# Patient Record
Sex: Male | Born: 1980 | Race: Asian | Hispanic: No | Marital: Married | Smoking: Never smoker
Health system: Southern US, Community
[De-identification: ages and names within clinical notes are randomized; demographics above are authoritative.]

## PROBLEM LIST (undated history)

## (undated) DIAGNOSIS — E785 Hyperlipidemia, unspecified: Secondary | ICD-10-CM

## (undated) DIAGNOSIS — R7989 Other specified abnormal findings of blood chemistry: Secondary | ICD-10-CM

## (undated) HISTORY — DX: Hyperlipidemia, unspecified: E78.5

## (undated) HISTORY — DX: Other specified abnormal findings of blood chemistry: R79.89

---

## 2016-07-15 LAB — LIPID PANEL
Cholesterol: 196 mg/dL (ref 0–200)
HDL: 24 mg/dL — AB (ref 35–70)
LDL CALC: 116 mg/dL
Triglycerides: 281 mg/dL — AB (ref 40–160)

## 2016-07-15 LAB — BASIC METABOLIC PANEL: Glucose: 106 mg/dL

## 2016-09-13 ENCOUNTER — Telehealth: Payer: Self-pay | Admitting: *Deleted

## 2016-09-13 NOTE — Telephone Encounter (Signed)
PreVisit Call attempted. Pt states that he filled out all of the new patient packet and he will bring it in tomorrow.

## 2016-09-14 ENCOUNTER — Encounter: Payer: Self-pay | Admitting: Family Medicine

## 2016-09-14 ENCOUNTER — Ambulatory Visit (INDEPENDENT_AMBULATORY_CARE_PROVIDER_SITE_OTHER): Payer: Managed Care, Other (non HMO) | Admitting: Family Medicine

## 2016-09-14 VITALS — BP 134/82 | HR 96 | Temp 97.7°F | Ht 75.0 in | Wt 217.2 lb

## 2016-09-14 DIAGNOSIS — E782 Mixed hyperlipidemia: Secondary | ICD-10-CM | POA: Diagnosis not present

## 2016-09-14 DIAGNOSIS — E663 Overweight: Secondary | ICD-10-CM | POA: Diagnosis not present

## 2016-09-14 DIAGNOSIS — R7989 Other specified abnormal findings of blood chemistry: Secondary | ICD-10-CM

## 2016-09-14 DIAGNOSIS — E785 Hyperlipidemia, unspecified: Secondary | ICD-10-CM | POA: Insufficient documentation

## 2016-09-14 DIAGNOSIS — E559 Vitamin D deficiency, unspecified: Secondary | ICD-10-CM | POA: Diagnosis not present

## 2016-09-14 LAB — LIPID PANEL
Cholesterol: 197 mg/dL (ref 0–200)
HDL: 33 mg/dL — ABNORMAL LOW (ref >39.00)
LDL Cholesterol: 142 mg/dL — ABNORMAL HIGH (ref 0–99)
NonHDL: 164.16
Total CHOL/HDL Ratio: 6
Triglycerides: 112 mg/dL (ref 0.0–149.0)
VLDL: 22.4 mg/dL (ref 0.0–40.0)

## 2016-09-14 LAB — VITAMIN D 25 HYDROXY (VIT D DEFICIENCY, FRACTURES): VITD: 15.63 ng/mL — ABNORMAL LOW (ref 30.00–100.00)

## 2016-09-14 NOTE — Progress Notes (Signed)
Brent Pineda is a 36 y.o. male is here to [employer].   History of Present Illness:   Insurance claims handler, CMA, acting as scribe for Dr. Earlene Plater.  CC:  The patient comes in today to establish care.  Recently moved here from [place], [place].  States he had lab work done in January 2018 that showed elevated triglycerides.  He has since been dieting and has lost 20 pounds.  Would like to have labs done today to see where his numbers are.  No specific complaints or concerns.  HPI:  Brent Pineda is a 37 y.o. male who presents for evaluation of dyslipidemia. The patient does not use medications that may worsen dyslipidemias (corticosteroids, progestins, anabolic steroids, diuretics, beta-blockers, amiodarone, cyclosporine, olanzapine). Exercise: three times a week. Previous history of cardiac disease includes: None. Cardiovascular ROS: no chest pain or dyspnea on exertion.  Lipids:    Component Value Date/Time   CHOL 196 07/15/2016   TRIG 281 (A) 07/15/2016   HDL 24 (A) 07/15/2016   Health Maintenance Due  Topic Date Due  . HIV Screening  02/11/1997  . TETANUS/TDAP  02/11/2001   PMHx, SurgHx, SocialHx, Medications, and Allergies were reviewed in the Visit Navigator and updated as appropriate.   Past Medical History:  Diagnosis Date  . HLD (hyperlipidemia)   . Low vitamin D level    History reviewed. No pertinent surgical history.  History reviewed. No pertinent family history.  Social History  Substance Use Topics  . Smoking status: Never Smoker  . Smokeless tobacco: Never Used  . Alcohol use Yes     Comment: occasionally    Current Medications and Allergies:   No current outpatient prescriptions on file.  No Known Allergies   Review of Systems:   Review of Systems  Constitutional: Negative for chills and fever.  HENT: Negative for congestion.   Eyes: Negative for blurred vision.  Respiratory: Negative for cough.   Cardiovascular: Negative for chest pain and  palpitations.  Gastrointestinal: Negative for abdominal pain and nausea.  Genitourinary: Negative for frequency.  Musculoskeletal: Negative for back pain.  Skin: Negative for rash.  Neurological: Negative for loss of consciousness and headaches.  Psychiatric/Behavioral: Negative for depression. The patient is not nervous/anxious.     Vitals:   Vitals:   09/14/16 0908  BP: 134/82  Pulse: 96  Temp: 97.7 F (36.5 C)  TempSrc: Oral  SpO2: 96%  Weight: 217 lb 3.2 oz (98.5 kg)  Height: 6\' 3"  (1.905 m)     Body mass index is 27.15 kg/m.   Physical Exam:   Physical Exam  Constitutional: He is oriented to person, place, and time. He appears well-developed and well-nourished. No distress.  HENT:  Head: Normocephalic and atraumatic.  Right Ear: External ear normal.  Left Ear: External ear normal.  Nose: Nose normal.  Mouth/Throat: Oropharynx is clear and moist.  Eyes: Conjunctivae and EOM are normal. Pupils are equal, round, and reactive to light.  Neck: Normal range of motion. Neck supple.  Cardiovascular: Normal rate, regular rhythm, normal heart sounds and intact distal pulses.   Pulmonary/Chest: Effort normal and breath sounds normal.  Abdominal: Soft. Bowel sounds are normal.  Musculoskeletal: Normal range of motion.  Neurological: He is alert and oriented to person, place, and time.  Skin: Skin is warm and dry.  Psychiatric: He has a normal mood and affect. His behavior is normal. Judgment and thought content normal.  Nursing note and vitals reviewed.    Assessment and Plan:  Brent BonesSatyadev was seen today for establish care.  Diagnoses and all orders for this visit:  Mixed hyperlipidemia -     Lipid panel  Low vitamin D level -     VITAMIN D 25 Hydroxy (Vit-D Deficiency, Fractures)  Overweight (BMI 25.0-29.9) Comments: The patient is asked to make an attempt to improve diet and exercise patterns to aid in medical management of this problem.  . Reviewed  expectations re: course of current medical issues. . Discussed self-management of symptoms. . Outlined signs and symptoms indicating need for more acute intervention. . Patient verbalized understanding and all questions were answered. . See orders for this visit as documented in the electronic medical record. . Patient received an After Visit Summary.  Records requested if needed. I spent 30 minutes with this patient, greater than 50% was face-to-face time counseling regarding the above diagnoses.  CMA served as Neurosurgeonscribe during this visit. History, Physical, and Plan performed by medical provider. Documentation and orders reviewed and attested to. Helane RimaErica Nimra Puccinelli, D.O.  Helane RimaErica Desean Heemstra, D.O. [place] 09/14/2016  There are no discontinued medications. Orders Placed This Encounter  Procedures  . Basic metabolic panel  . Lipid panel  . Lipid panel  . VITAMIN D 25 Hydroxy (Vit-D Deficiency, Fractures)

## 2016-09-14 NOTE — Progress Notes (Signed)
Pre visit review using our clinic review tool, if applicable. No additional management support is needed unless otherwise documented below in the visit note. 

## 2016-09-15 ENCOUNTER — Telehealth: Payer: Self-pay | Admitting: Family Medicine

## 2016-09-15 ENCOUNTER — Telehealth: Payer: Self-pay

## 2016-09-15 MED ORDER — CHOLECALCIFEROL 1.25 MG (50000 UT) PO TABS: ORAL_TABLET | ORAL | 0 refills | Status: DC

## 2016-09-15 NOTE — Telephone Encounter (Signed)
Received call from RN at [facility].  Patient was on the phone stating that he only had one tube of blood drawn at yesterday's visit and his wife says he should have had 3 tubes drawn.  Informed nurse from [facility] that vitamin D and lipid panel were the only tests ordered and the lab drew the amount they needed for those two tests.  When Dr. Earlene PlaterWallace has reviewed test results, we will contact the patient.  Nurse verbalized understanding and stated she would inform the patient.  *patient also sent a MyChart message this morning about the same thing and it was responded to by Britt BottomJamie Wheeley.*

## 2016-09-15 NOTE — Addendum Note (Signed)
Addended by: Helane RimaWALLACE, Chalon Zobrist R on: 09/15/2016 10:40 AM   Modules accepted: Orders

## 2016-09-15 NOTE — Telephone Encounter (Signed)
[facility] Night - [place] TELEPHONE ADVICE RECORD [facility] Patient Name: Brent Pineda DOB: Oct 22, 1980 Initial Comment Caller states was in yesterday for bloodwork; they only took one sample; when checked w/his wife she said should have been 3; need to come back to have more blood work drawn? Nurse Assessment Nurse: Sandria ManlyLove, RN, Lyla Sonarrie Date/Time (Eastern Time): 09/15/2016 8:45:21 AM Confirm and document reason for call. If symptomatic, describe symptoms. ---Caller states was in yesterday for blood work & they only took one sample/1 tube. When checked w/his wife she said should have been 3. He is wondering if he needs to come back to have more blood work drawn. No new or worsening symptoms. Does the patient have any new or worsening symptoms? ---No Guidelines Guideline Title Affirmed Question Affirmed Notes Final Disposition User Comments Spoke with office & confirmed they got everything they needed. Advised patient they collected all the blood they needed for a Lipid panel & Vit D test. Caller verbalized understanding.

## 2016-11-12 ENCOUNTER — Other Ambulatory Visit: Payer: Self-pay | Admitting: Family Medicine

## 2016-11-12 DIAGNOSIS — R7989 Other specified abnormal findings of blood chemistry: Secondary | ICD-10-CM

## 2016-11-12 NOTE — Telephone Encounter (Signed)
Please advise on refill. Does patient need to come back in to redraw labs?

## 2016-11-12 NOTE — Telephone Encounter (Signed)
Take OTC Vitamin D 5000 IU daily.

## 2016-11-12 NOTE — Telephone Encounter (Signed)
Notified patient.

## 2017-09-28 ENCOUNTER — Ambulatory Visit (INDEPENDENT_AMBULATORY_CARE_PROVIDER_SITE_OTHER): Payer: Managed Care, Other (non HMO) | Admitting: Family Medicine

## 2017-09-28 ENCOUNTER — Encounter: Payer: Self-pay | Admitting: Family Medicine

## 2017-09-28 VITALS — BP 110/68 | HR 79 | Temp 98.5°F | Ht 75.0 in | Wt 239.2 lb

## 2017-09-28 DIAGNOSIS — E049 Nontoxic goiter, unspecified: Secondary | ICD-10-CM | POA: Diagnosis not present

## 2017-09-28 DIAGNOSIS — Z0000 Encounter for general adult medical examination without abnormal findings: Secondary | ICD-10-CM | POA: Diagnosis not present

## 2017-09-28 DIAGNOSIS — E663 Overweight: Secondary | ICD-10-CM

## 2017-09-28 DIAGNOSIS — E559 Vitamin D deficiency, unspecified: Secondary | ICD-10-CM | POA: Diagnosis not present

## 2017-09-28 DIAGNOSIS — Z114 Encounter for screening for human immunodeficiency virus [HIV]: Secondary | ICD-10-CM | POA: Diagnosis not present

## 2017-09-28 DIAGNOSIS — E782 Mixed hyperlipidemia: Secondary | ICD-10-CM | POA: Diagnosis not present

## 2017-09-28 LAB — LIPID PANEL
Cholesterol: 208 mg/dL — ABNORMAL HIGH (ref 0–200)
HDL: 35.8 mg/dL — ABNORMAL LOW (ref >39.00)
NonHDL: 171.7
Total CHOL/HDL Ratio: 6
Triglycerides: 242 mg/dL — ABNORMAL HIGH (ref 0.0–149.0)
VLDL: 48.4 mg/dL — ABNORMAL HIGH (ref 0.0–40.0)

## 2017-09-28 LAB — COMPREHENSIVE METABOLIC PANEL
ALT: 23 U/L (ref 0–53)
AST: 20 U/L (ref 0–37)
Albumin: 4.3 g/dL (ref 3.5–5.2)
Alkaline Phosphatase: 62 U/L (ref 39–117)
BUN: 10 mg/dL (ref 6–23)
CO2: 27 mEq/L (ref 19–32)
Calcium: 10 mg/dL (ref 8.4–10.5)
Chloride: 104 mEq/L (ref 96–112)
Creatinine, Ser: 0.7 mg/dL (ref 0.40–1.50)
GFR: 135.91 mL/min (ref >60.00)
Glucose, Bld: 96 mg/dL (ref 70–99)
Potassium: 4.4 mEq/L (ref 3.5–5.1)
Sodium: 138 mEq/L (ref 135–145)
Total Bilirubin: 0.6 mg/dL (ref 0.2–1.2)
Total Protein: 7.8 g/dL (ref 6.0–8.3)

## 2017-09-28 LAB — CBC WITH DIFFERENTIAL/PLATELET
Basophils Absolute: 0 10*3/uL (ref 0.0–0.1)
Basophils Relative: 0.7 % (ref 0.0–3.0)
Eosinophils Absolute: 0 10*3/uL (ref 0.0–0.7)
Eosinophils Relative: 0.5 % (ref 0.0–5.0)
HCT: 43.8 % (ref 39.0–52.0)
Hemoglobin: 14.7 g/dL (ref 13.0–17.0)
Lymphocytes Relative: 30.3 % (ref 12.0–46.0)
Lymphs Abs: 1.8 10*3/uL (ref 0.7–4.0)
MCHC: 33.7 g/dL (ref 30.0–36.0)
MCV: 87 fl (ref 78.0–100.0)
Monocytes Absolute: 0.4 10*3/uL (ref 0.1–1.0)
Monocytes Relative: 6.2 % (ref 3.0–12.0)
Neutro Abs: 3.7 10*3/uL (ref 1.4–7.7)
Neutrophils Relative %: 62.3 % (ref 43.0–77.0)
Platelets: 220 10*3/uL (ref 150.0–400.0)
RBC: 5.03 Mil/uL (ref 4.22–5.81)
RDW: 13.8 % (ref 11.5–15.5)
WBC: 6 10*3/uL (ref 4.0–10.5)

## 2017-09-28 LAB — VITAMIN D 25 HYDROXY (VIT D DEFICIENCY, FRACTURES): VITD: 21.97 ng/mL — ABNORMAL LOW (ref 30.00–100.00)

## 2017-09-28 LAB — TSH: TSH: 1.64 u[IU]/mL (ref 0.35–4.50)

## 2017-09-28 LAB — LDL CHOLESTEROL, DIRECT: Direct LDL: 139 mg/dL

## 2017-09-28 NOTE — Progress Notes (Signed)
Subjective:    Tristin Vandeusen is a 37 y.o. male who presents today for his Complete Annual Exam. He feels well. He is not exercising regularly. He reports he is sleeping fairly well.   Health Maintenance Due  Topic Date Due  . HIV Screening  02/11/1997   PMHx, SurgHx, SocialHx, Medications, and Allergies were reviewed in the Visit Navigator and updated as appropriate.   Past Medical History:  Diagnosis Date  . HLD (hyperlipidemia)   . Low vitamin D level    History reviewed. No pertinent surgical history. History reviewed. No pertinent family history.   Social History   Tobacco Use  . Smoking status: Never Smoker  . Smokeless tobacco: Never Used  Substance Use Topics  . Alcohol use: Yes    Comment: occasionally  . Drug use: No   Review of Systems:   Pertinent items are noted in the HPI. Otherwise, ROS is negative.  Objective:   Vitals:   09/28/17 0824  BP: 110/68  Pulse: 79  Temp: 98.5 F (36.9 C)  SpO2: 97%   Body mass index is 29.9 kg/m.  General Appearance:  Alert, cooperative, no distress, appears stated age  Head:  Normocephalic, without obvious abnormality, atraumatic  Eyes:  PERRL, conjunctiva/corneas clear, EOM's intact, fundi benign, both eyes       Ears:  Normal TM's and external ear canals, both ears  Nose: Nares normal, septum midline, mucosa normal, no drainage    or sinus tenderness  Throat: Lips, mucosa, and tongue normal; teeth and gums normal  Neck: Supple, symmetrical, trachea midline, no adenopathy; thyroid:  No enlargement/tenderness/nodules; no carotit bruit or JVD  Back:   Symmetric, no curvature, ROM normal, no CVA tenderness  Lungs:   Clear to auscultation bilaterally, respirations unlabored  Chest wall:  No tenderness or deformity  Heart:  Regular rate and rhythm, S1 and S2 normal, no murmur, rub   or gallop  Abdomen:   Soft, non-tender, bowel sounds active all four quadrants, no masses, no organomegaly  Extremities: Extremities  normal, atraumatic, no cyanosis or edema  Prostate: Not done.   Skin: Skin color, texture, turgor normal, no rashes or lesions  Lymph nodes: Cervical, supraclavicular, and axillary nodes normal  Neurologic: CNII-XII grossly intact. Normal strength, sensation and reflexes throughout   Assessment/Plan:   Diagnoses and all orders for this visit:  Routine physical examination  Mixed hyperlipidemia -     Comprehensive metabolic panel -     Lipid panel  Overweight (BMI 25.0-29.9)  Goiter -     CBC with Differential/Platelet -     TSH  Vitamin D deficiency -     VITAMIN D 25 Hydroxy (Vit-D Deficiency, Fractures)   Patient Counseling: [x]   Nutrition: Stressed importance of moderation in sodium/caffeine intake, saturated fat and cholesterol, caloric balance, sufficient intake of fresh fruits, vegetables, and fiber.  [x]   Stressed the importance of regular exercise.   []   Substance Abuse: Discussed cessation/primary prevention of tobacco, alcohol, or other drug use; driving or other dangerous activities under the influence; availability of treatment for abuse.   [x]   Injury prevention: Discussed safety belts, safety helmets, smoke detector, smoking near bedding or upholstery.   []   Sexuality: Discussed sexually transmitted diseases, partner selection, use of condoms, avoidance of unintended pregnancy and contraceptive alternatives.   [x]   Dental health: Discussed importance of regular tooth brushing, flossing, and dental visits.  [x]   Health maintenance and immunizations reviewed. Please refer to Health maintenance section.   Alcario Drought  Earlene PlaterWallace, DO [facility]

## 2017-09-29 LAB — HIV ANTIBODY (ROUTINE TESTING W REFLEX): HIV 1&2 Ab, 4th Generation: NONREACTIVE

## 2017-10-10 ENCOUNTER — Other Ambulatory Visit: Payer: Self-pay | Admitting: Family Medicine

## 2017-10-10 MED ORDER — AMOXICILLIN 875 MG PO TABS
875.0000 mg | ORAL_TABLET | Freq: Two times a day (BID) | ORAL | 0 refills | Status: DC
Start: 2017-10-10 — End: 2018-04-04

## 2018-04-04 ENCOUNTER — Ambulatory Visit (INDEPENDENT_AMBULATORY_CARE_PROVIDER_SITE_OTHER): Payer: Managed Care, Other (non HMO)

## 2018-04-04 ENCOUNTER — Ambulatory Visit: Payer: Managed Care, Other (non HMO) | Admitting: Family Medicine

## 2018-04-04 VITALS — BP 126/72 | HR 89 | Temp 98.1°F | Ht 75.0 in | Wt 237.8 lb

## 2018-04-04 DIAGNOSIS — M533 Sacrococcygeal disorders, not elsewhere classified: Secondary | ICD-10-CM

## 2018-04-04 NOTE — Patient Instructions (Signed)
Tailbone Injury The tailbone is the small bone at the lower end of the backbone (spine). You may have stretched tissues, bruises, or a broken bone (fracture). These injuries can be painful. Most tailbone injuries get better on their own in 4-6 weeks. Follow these instructions at home:  Take medicines only as told by your doctor.  If told, apply ice to the injured area. ? Put ice in a plastic bag. ? Place a towel between your skin and the bag. ? Leave the ice on for 20 minutes, 2-3 times per day. Do this for the first 1-2 days.  Sit on a large, rubber or inflated ring or cushion to lessen pain. Lean forward when you sit to help lessen pain.  Avoid sitting in one place for a long time.  Increase your activity as the pain allows.  Do exercises as told by your doctor or physical therapist.  If it is painful to poop, take medicine to help you poop (stool softeners) as told by your doctor.  Eat foods that have plenty of fiber.  Keep all follow-up visits as told by your doctor. This is important. Contact a doctor if:  Your pain gets worse.  Pooping causes you pain.  You cannot poop (constipation).  You are leaking pee (urinary incontinence).  You have a fever. This information is not intended to replace advice given to you by your health care provider. Make sure you discuss any questions you have with your health care provider. Document Released: 07/10/2010 Document Revised: 02/05/2016 Document Reviewed: 06/03/2014 Elsevier Interactive Patient Education  2018 Elsevier Inc.  

## 2018-04-04 NOTE — Progress Notes (Signed)
   Brent Pineda is a 37 y.o. male here for an acute visit.  History of Present Illness:   HPI: Tailbone pain x 1 week after falling down his steps last week. No other injury. Has tried NSAIDs, ice, rest. No prior injury. No constipation. No incontinence.  PMHx, SurgHx, SocialHx, Medications, and Allergies were reviewed in the Visit Navigator and updated as appropriate.  Current Medications:   No current outpatient medications on file.   No Known Allergies   Review of Systems:   Pertinent items are noted in the HPI. Otherwise, ROS is negative.  Vitals:   Vitals:   04/04/18 1500  BP: 126/72  Pulse: 89  Temp: 98.1 F (36.7 C)  TempSrc: Oral  SpO2: 96%  Weight: 237 lb 12.8 oz (107.9 kg)  Height: 6\' 3"  (1.905 m)     Body mass index is 29.72 kg/m.  Physical Exam:   Physical Exam  Constitutional: He is oriented to person, place, and time. He appears well-developed and well-nourished. No distress.  HENT:  Head: Normocephalic and atraumatic.  Right Ear: External ear normal.  Left Ear: External ear normal.  Nose: Nose normal.  Mouth/Throat: Oropharynx is clear and moist.  Eyes: Pupils are equal, round, and reactive to light. Conjunctivae and EOM are normal.  Neck: Normal range of motion. Neck supple.  Cardiovascular: Normal rate, regular rhythm, normal heart sounds and intact distal pulses.  Pulmonary/Chest: Effort normal and breath sounds normal.  Abdominal: Soft. Bowel sounds are normal.  Musculoskeletal: Normal range of motion.       Back:  TTP, worse with flexion.  Neurological: He is alert and oriented to person, place, and time.  Skin: Skin is warm and dry.  Psychiatric: He has a normal mood and affect. His behavior is normal. Judgment and thought content normal.  Nursing note and vitals reviewed.  Assessment and Plan:   Brent Pineda was seen today for tailbone pain.  Diagnoses and all orders for this visit:  Tail bone pain -     DG Sacrum/Coccyx;  Future   . Reviewed expectations re: course of current medical issues. . Discussed self-management of symptoms. . Outlined signs and symptoms indicating need for more acute intervention. . Patient verbalized understanding and all questions were answered. Marland Kitchen Health Maintenance issues including appropriate healthy diet, exercise, and smoking avoidance were discussed with patient. . See orders for this visit as documented in the electronic medical record. . Patient received an After Visit Summary.   Helane Rima, DO [place] 04/05/2018

## 2018-04-05 ENCOUNTER — Encounter: Payer: Self-pay | Admitting: Family Medicine

## 2018-04-12 ENCOUNTER — Ambulatory Visit (INDEPENDENT_AMBULATORY_CARE_PROVIDER_SITE_OTHER): Payer: Managed Care, Other (non HMO) | Admitting: Sports Medicine

## 2018-04-12 ENCOUNTER — Encounter: Payer: Self-pay | Admitting: Sports Medicine

## 2018-04-12 VITALS — BP 110/76 | HR 78 | Ht 75.0 in | Wt 237.2 lb

## 2018-04-12 DIAGNOSIS — S32131A Minimally displaced Zone III fracture of sacrum, initial encounter for closed fracture: Secondary | ICD-10-CM | POA: Diagnosis not present

## 2018-04-12 DIAGNOSIS — M533 Sacrococcygeal disorders, not elsewhere classified: Secondary | ICD-10-CM

## 2018-04-12 MED ORDER — CYCLOBENZAPRINE HCL 5 MG PO TABS: 5.0000 mg | ORAL_TABLET | Freq: Every day | ORAL | 1 refills | Status: DC

## 2018-04-12 NOTE — Progress Notes (Signed)
Brent Pineda. [name] Sports Medicine [facility] [phone]  Brent Pineda - 37 y.o. male MRN [id number]  Date of birth: 02/05/1981  Visit Date: 04/12/2018  PCP: Helane Rima, DO   Referred by: Helane Rima, DO   Scribe(s) for today's visit: Stevenson Clinch, CMA  SUBJECTIVE:  Brent Pineda is here for Initial Assessment (tail bone pain)   HPI: His tail bone pain symptoms INITIALLY: Began about 2 weeks ago and is the result of a fall.  Described as mild soreness with standing, more moderate pain with sitting, radiating to the lower back, tension.  Worsened with sitting.  Improved with standing.  Additional associated symptoms include: XR showed fx of the fifth sacral segment. He denies pain in his legs or groin. He is unable to sleep on his back, he is trying to sleep on his side. He denies loss of control of bladder or bowel fx.     At this time symptoms are improving compared to onset. He has been using IcyHot and Advil prn with temporary relief.     REVIEW OF SYSTEMS: Reports night time disturbances. Denies fevers, chills, or night sweats. Denies unexplained weight loss. Denies personal history of cancer. Denies changes in bowel or bladder habits. Reports recent unreported falls. Denies new or worsening dyspnea or wheezing. Denies headaches or dizziness.  Denies numbness, tingling or weakness  In the extremities.  Denies dizziness or presyncopal episodes Denies lower extremity edema    HISTORY:  Prior history reviewed and updated per electronic medical record.  Social History   Occupational History  . Not on file  Tobacco Use  . Smoking status: Never Smoker  . Smokeless tobacco: Never Used  Substance and Sexual Activity  . Alcohol use: Yes    Comment: occasionally  . Drug use: No  . Sexual activity: Yes   Social History   Social History Narrative  . Not on file   Past Medical History:  Diagnosis Date    . HLD (hyperlipidemia)   . Low vitamin D level    History reviewed. No pertinent surgical history. family history is not on file.  DATA OBTAINED & REVIEWED:   Recent Labs    09/28/17 0844  CALCIUM 10.0  AST 20  ALT 23  TSH 1.64   No problems updated. No specialty comments available.  OBJECTIVE:  VS:  HT:6\' 3"  (190.5 cm)   WT:237 lb 3.2 oz (107.6 kg)  BMI:29.65    BP:110/76  HR:78bpm  TEMP: ( )  RESP:95 %   PHYSICAL EXAM: CONSTITUTIONAL: Well-developed, Well-nourished and In no acute distress PSYCHIATRIC: Alert & appropriately interactive. and Not depressed or anxious appearing. RESPIRATORY: No increased work of breathing and Trachea Midline EYES: Pupils are equal., EOM intact without nystagmus. and No scleral icterus.  VASCULAR EXAM: Warm and well perfused NEURO: Strength: Normal strength in associated myotomes to manual muscle testing. Sensation: normal to light touch Reflexes: reflexes are normal and symmetric and DTR's are 2/4 in all tested locations  MSK Exam: Back and tailbone: He has moderate tenderness over the medial aspect of the tailbone.  There is no palpable defect or step-off.  He has no significant weakness in the lower extremities.  X-rays reviewed today that do show zone 3 fracture of the sacrum.  ASSESSMENT   1. Tail bone pain   2. Closed minimally displaced zone III fracture of sacrum, initial encounter [facility])     PLAN:  Pertinent additional documentation may be included  in corresponding procedure notes, imaging studies, problem based documentation and patient instructions.  Procedures:  . None  Medications:  Meds ordered this encounter  Medications  . cyclobenzaprine (FLEXERIL) 5 MG tablet    Sig: Take 1 tablet (5 mg total) by mouth at bedtime.    Dispense:  30 tablet    Refill:  1   Discussion/Instructions: No problem-specific Assessment & Plan notes found for this encounter.  . Symptoms are consistent with a zone 3  fracture.  . Discussed the anticipated course of recovery which can be up to 12 weeks. . Discussed red flag symptoms that warrant earlier emergent evaluation and patient voices understanding. . Activity modifications and the importance of avoiding exacerbating activities (limiting pain to no more than a 4 / 10 during or following activity) recommended and discussed.  Follow-up:  . Return in about 4 weeks (around 05/10/2018) for repeat X-rays.   . At follow up will plan: repeat X-rays of Sacrum         [name], DO     Sports Medicine Physician

## 2018-05-23 ENCOUNTER — Ambulatory Visit: Payer: Managed Care, Other (non HMO) | Admitting: Sports Medicine

## 2018-07-07 ENCOUNTER — Encounter: Payer: Self-pay | Admitting: Sports Medicine

## 2018-09-19 ENCOUNTER — Other Ambulatory Visit: Payer: Self-pay

## 2018-09-19 NOTE — Telephone Encounter (Signed)
Last OV 04/12/2018 Last refill 04/12/2018 #30/1 Next OV not scheduled  Forwarding to Dr. Berline Chough

## 2018-09-19 NOTE — Telephone Encounter (Signed)
Can we schedule a \\WebEx  for him?   Initially was from a fall and likely tailbone fracture.  This is obviously new

## 2018-09-20 NOTE — Telephone Encounter (Signed)
Spoke with pt, he advised that he does not need refill at this time, disregard request.

## 2019-01-24 ENCOUNTER — Telehealth: Payer: Managed Care, Other (non HMO) | Admitting: Family Medicine

## 2019-01-26 ENCOUNTER — Telehealth: Payer: Managed Care, Other (non HMO) | Admitting: Family Medicine

## 2019-04-23 ENCOUNTER — Other Ambulatory Visit: Payer: Self-pay

## 2019-04-23 ENCOUNTER — Ambulatory Visit (INDEPENDENT_AMBULATORY_CARE_PROVIDER_SITE_OTHER): Payer: Managed Care, Other (non HMO) | Admitting: Family Medicine

## 2019-04-23 ENCOUNTER — Encounter: Payer: Self-pay | Admitting: Family Medicine

## 2019-04-23 VITALS — BP 130/68 | HR 76 | Temp 97.7°F | Ht 75.0 in | Wt 240.0 lb

## 2019-04-23 DIAGNOSIS — E782 Mixed hyperlipidemia: Secondary | ICD-10-CM | POA: Diagnosis not present

## 2019-04-23 DIAGNOSIS — Z23 Encounter for immunization: Secondary | ICD-10-CM

## 2019-04-23 DIAGNOSIS — Z0001 Encounter for general adult medical examination with abnormal findings: Secondary | ICD-10-CM | POA: Diagnosis not present

## 2019-04-23 DIAGNOSIS — E669 Obesity, unspecified: Secondary | ICD-10-CM

## 2019-04-23 DIAGNOSIS — Z6830 Body mass index (BMI) 30.0-30.9, adult: Secondary | ICD-10-CM

## 2019-04-23 DIAGNOSIS — R7989 Other specified abnormal findings of blood chemistry: Secondary | ICD-10-CM

## 2019-04-23 LAB — CBC
HCT: 41.3 % (ref 39.0–52.0)
Hemoglobin: 13.8 g/dL (ref 13.0–17.0)
MCHC: 33.5 g/dL (ref 30.0–36.0)
MCV: 87.3 fl (ref 78.0–100.0)
Platelets: 209 10*3/uL (ref 150.0–400.0)
RBC: 4.73 Mil/uL (ref 4.22–5.81)
RDW: 13.4 % (ref 11.5–15.5)
WBC: 7.4 10*3/uL (ref 4.0–10.5)

## 2019-04-23 LAB — COMPREHENSIVE METABOLIC PANEL
ALT: 22 U/L (ref 0–53)
AST: 20 U/L (ref 0–37)
Albumin: 4.4 g/dL (ref 3.5–5.2)
Alkaline Phosphatase: 69 U/L (ref 39–117)
BUN: 15 mg/dL (ref 6–23)
CO2: 26 mEq/L (ref 19–32)
Calcium: 9.5 mg/dL (ref 8.4–10.5)
Chloride: 102 mEq/L (ref 96–112)
Creatinine, Ser: 0.6 mg/dL (ref 0.40–1.50)
GFR: 151.44 mL/min (ref >60.00)
Glucose, Bld: 86 mg/dL (ref 70–99)
Potassium: 4.1 mEq/L (ref 3.5–5.1)
Sodium: 136 mEq/L (ref 135–145)
Total Bilirubin: 0.5 mg/dL (ref 0.2–1.2)
Total Protein: 7.1 g/dL (ref 6.0–8.3)

## 2019-04-23 LAB — LDL CHOLESTEROL, DIRECT: Direct LDL: 145 mg/dL

## 2019-04-23 LAB — LIPID PANEL
Cholesterol: 213 mg/dL — ABNORMAL HIGH (ref 0–200)
HDL: 33.1 mg/dL — ABNORMAL LOW (ref >39.00)
NonHDL: 180.23
Total CHOL/HDL Ratio: 6
Triglycerides: 258 mg/dL — ABNORMAL HIGH (ref 0.0–149.0)
VLDL: 51.6 mg/dL — ABNORMAL HIGH (ref 0.0–40.0)

## 2019-04-23 LAB — VITAMIN D 25 HYDROXY (VIT D DEFICIENCY, FRACTURES): VITD: 16.61 ng/mL — ABNORMAL LOW (ref 30.00–100.00)

## 2019-04-23 LAB — TSH: TSH: 1.06 u[IU]/mL (ref 0.35–4.50)

## 2019-04-23 NOTE — Patient Instructions (Signed)
It was very nice to see you today!  Keep up the good work!  We will check blood work today.  Please come back to see me in 1 year for your next physical, or sooner if needed.  Take care, Dr Jerline Pain  Please try these tips to maintain a healthy lifestyle:   Eat at least 3 REAL meals and 1-2 snacks per day.  Aim for no more than 5 hours between eating.  If you eat breakfast, please do so within one hour of getting up.    Obtain twice as many fruits/vegetables as protein or carbohydrate foods for both lunch and dinner. (Half of each meal should be fruits/vegetables, one quarter protein, and one quarter starchy carbs)   Cut down on sweet beverages. This includes juice, soda, and sweet tea.    Exercise at least 150 minutes every week.    Preventive Care 53-68 Years Old, Male Preventive care refers to lifestyle choices and visits with your health care provider that can promote health and wellness. This includes:  A yearly physical exam. This is also called an annual well check.  Regular dental and eye exams.  Immunizations.  Screening for certain conditions.  Healthy lifestyle choices, such as eating a healthy diet, getting regular exercise, not using drugs or products that contain nicotine and tobacco, and limiting alcohol use. What can I expect for my preventive care visit? Physical exam Your health care provider will check:  Height and weight. These may be used to calculate body mass index (BMI), which is a measurement that tells if you are at a healthy weight.  Heart rate and blood pressure.  Your skin for abnormal spots. Counseling Your health care provider may ask you questions about:  Alcohol, tobacco, and drug use.  Emotional well-being.  Home and relationship well-being.  Sexual activity.  Eating habits.  Work and work Statistician. What immunizations do I need?  Influenza (flu) vaccine  This is recommended every year. Tetanus, diphtheria, and pertussis  (Tdap) vaccine  You may need a Td booster every 10 years. Varicella (chickenpox) vaccine  You may need this vaccine if you have not already been vaccinated. Human papillomavirus (HPV) vaccine  If recommended by your health care provider, you may need three doses over 6 months. Measles, mumps, and rubella (MMR) vaccine  You may need at least one dose of MMR. You may also need a second dose. Meningococcal conjugate (MenACWY) vaccine  One dose is recommended if you are 74-59 years old and a Market researcher living in a residence hall, or if you have one of several medical conditions. You may also need additional booster doses. Pneumococcal conjugate (PCV13) vaccine  You may need this if you have certain conditions and were not previously vaccinated. Pneumococcal polysaccharide (PPSV23) vaccine  You may need one or two doses if you smoke cigarettes or if you have certain conditions. Hepatitis A vaccine  You may need this if you have certain conditions or if you travel or work in places where you may be exposed to hepatitis A. Hepatitis B vaccine  You may need this if you have certain conditions or if you travel or work in places where you may be exposed to hepatitis B. Haemophilus influenzae type b (Hib) vaccine  You may need this if you have certain risk factors. You may receive vaccines as individual doses or as more than one vaccine together in one shot (combination vaccines). Talk with your health care provider about the risks and benefits  combination vaccines. What tests do I need? Blood tests  Lipid and cholesterol levels. These may be checked every 5 years starting at age 20.  Hepatitis C test.  Hepatitis B test. Screening   Diabetes screening. This is done by checking your blood sugar (glucose) after you have not eaten for a while (fasting).  Sexually transmitted disease (STD) testing. Talk with your health care provider about your test results, treatment  options, and if necessary, the need for more tests. Follow these instructions at home: Eating and drinking   Eat a diet that includes fresh fruits and vegetables, whole grains, lean protein, and low-fat dairy products.  Take vitamin and mineral supplements as recommended by your health care provider.  Do not drink alcohol if your health care provider tells you not to drink.  If you drink alcohol: ? Limit how much you have to 0-2 drinks a day. ? Be aware of how much alcohol is in your drink. In the [place] one drink equals one 12 oz bottle of beer (355 mL), one 5 oz glass of wine (148 mL), or one 1 oz glass of hard liquor (44 mL). Lifestyle  Take daily care of your teeth and gums.  Stay active. Exercise for at least 30 minutes on 5 or more days each week.  Do not use any products that contain nicotine or tobacco, such as cigarettes, e-cigarettes, and chewing tobacco. If you need help quitting, ask your health care provider.  If you are sexually active, practice safe sex. Use a condom or other form of protection to prevent STIs (sexually transmitted infections). What's next?  Go to your health care provider once a year for a well check visit.  Ask your health care provider how often you should have your eyes and teeth checked.  Stay up to date on all vaccines. This information is not intended to replace advice given to you by your health care provider. Make sure you discuss any questions you have with your health care provider. Document Released: 08/03/2001 Document Revised: 06/01/2018 Document Reviewed: 06/01/2018 Elsevier Patient Education  2020 Elsevier Inc[employer]  

## 2019-04-23 NOTE — Progress Notes (Signed)
Chief Complaint:  Brent Pineda is a 38 y.o. male who presents today for his annual comprehensive physical exam and to transfer care.   Assessment/Plan:  HLD (hyperlipidemia) Continue lifestyle modifications.  Check CBC, C met, TSH.  Low vitamin D level Check vitamin D level.   Body mass index is 30 kg/m. / Obese BMI Metric Follow Up - 04/23/19 1347      BMI Metric Follow Up-Please document annually   BMI Metric Follow Up  Education provided       Preventative Healthcare: Flu vaccine given today.  Check CBC, C met, TSH, lipid panel, and vitamin D.  Patient Counseling(The following topics were reviewed and/or handout was given):  -Nutrition: Stressed importance of moderation in sodium/caffeine intake, saturated fat and cholesterol, caloric balance, sufficient intake of fresh fruits, vegetables, and fiber.  -Stressed the importance of regular exercise.   -Substance Abuse: Discussed cessation/primary prevention of tobacco, alcohol, or other drug use; driving or other dangerous activities under the influence; availability of treatment for abuse.   -Injury prevention: Discussed safety belts, safety helmets, smoke detector, smoking near bedding or upholstery.   -Sexuality: Discussed sexually transmitted diseases, partner selection, use of condoms, avoidance of unintended pregnancy and contraceptive alternatives.   -Dental health: Discussed importance of regular tooth brushing, flossing, and dental visits.  -Health maintenance and immunizations reviewed. Please refer to Health maintenance section.  Return to care in 1 year for next preventative visit.     Subjective:  HPI:  He has no acute complaints today.   Lifestyle Diet: No specific diets or eating plans. Tries to avoid going out to eat.  Exercise: Likes to walk daily.   Depression screen PHQ 2/9 04/23/2019  Decreased Interest 0  Down, Depressed, Hopeless 0  PHQ - 2 Score 0    Health Maintenance Due  Topic Date  Due  . INFLUENZA VACCINE  01/20/2019     ROS: Per HPI, otherwise a complete review of systems was negative.   PMH:  The following were reviewed and entered/updated in epic: Past Medical History:  Diagnosis Date  . HLD (hyperlipidemia)   . Low vitamin D level    Patient Active Problem List   Diagnosis Date Noted  . Low vitamin D level   . HLD (hyperlipidemia)    No past surgical history on file.  No family history on file.  Medications- reviewed and updated No current outpatient medications on file.   No current facility-administered medications for this visit.     Allergies-reviewed and updated No Known Allergies  Social History   Socioeconomic History  . Marital status: Married    Spouse name: Not on file  . Number of children: Not on file  . Years of education: Not on file  . Highest education level: Not on file  Occupational History  . Not on file  Social Needs  . Financial resource strain: Not on file  . Food insecurity    Worry: Not on file    Inability: Not on file  . Transportation needs    Medical: Not on file    Non-medical: Not on file  Tobacco Use  . Smoking status: Never Smoker  . Smokeless tobacco: Never Used  Substance and Sexual Activity  . Alcohol use: Yes    Comment: occasionally  . Drug use: No  . Sexual activity: Yes  Lifestyle  . Physical activity    Days per week: Not on file    Minutes per session: Not on file  .  Stress: Not on file  Relationships  . Social Herbalist on phone: Not on file    Gets together: Not on file    Attends religious service: Not on file    Active member of club or organization: Not on file    Attends meetings of clubs or organizations: Not on file    Relationship status: Not on file  Other Topics Concern  . Not on file  Social History Narrative  . Not on file        Objective:  Physical Exam: BP 130/68   Pulse 76   Temp 97.7 F (36.5 C)   Ht _0  (1.905 m)   Wt 240 lb (108.9  kg)   SpO2 98%   BMI 30.00 kg/m   Body mass index is 30 kg/m. Wt Readings from Last 3 Encounters:  04/23/19 240 lb (108.9 kg)  04/12/18 237 lb 3.2 oz (107.6 kg)  04/04/18 237 lb 12.8 oz (107.9 kg)   Gen: NAD, resting comfortably HEENT: TMs normal bilaterally. OP clear. No thyromegaly noted.  CV: RRR with no murmurs appreciated Pulm: NWOB, CTAB with no crackles, wheezes, or rhonchi GI: Normal bowel sounds present. Soft, Nontender, Nondistended. MSK: no edema, cyanosis, or clubbing noted Skin: warm, dry Neuro: CN2-12 grossly intact. Strength 5/5 in upper and lower extremities. Reflexes symmetric and intact bilaterally.  Psych: Normal affect and thought content     Antwann Preziosi M. Jerline Pain, MD 04/23/2019 1:49 PM

## 2019-04-23 NOTE — Assessment & Plan Note (Signed)
Continue lifestyle modifications.  Check CBC, C met, TSH.

## 2019-04-23 NOTE — Assessment & Plan Note (Signed)
Check vitamin D level 

## 2019-04-24 ENCOUNTER — Other Ambulatory Visit: Payer: Self-pay

## 2019-04-24 MED ORDER — CHOLECALCIFEROL 1.25 MG (50000 UT) PO TABS: ORAL_TABLET | ORAL | 0 refills | Status: DC

## 2019-04-24 NOTE — Progress Notes (Signed)
vi 

## 2019-04-24 NOTE — Progress Notes (Signed)
Please inform patient of the following:  Cholesterol is borderline - do not need to start medications but he should work on diet and exercise and we can recheck in a year or so. Vitamin D is low. Recommend starting 50000IU weekly and we can recheck in 3-6 months.  All of his other blood work is NORMAL.  Brent Pineda. Jerline Pain, MD 04/24/2019 12:50 PM

## 2019-04-25 ENCOUNTER — Telehealth: Payer: Self-pay | Admitting: Family Medicine

## 2019-04-25 NOTE — Telephone Encounter (Signed)
Pt is requesting for his lab results to be uploaded/released on MyChart. Please advise

## 2019-04-25 NOTE — Telephone Encounter (Signed)
See below

## 2019-04-25 NOTE — Telephone Encounter (Signed)
Copied from [place] [id number]. Topic: General - Inquiry >> Apr 24, 2019  5:07 PM Alease Frame wrote: Reason for CRM: Patient is returning phone call please advise

## 2019-04-26 NOTE — Telephone Encounter (Signed)
Patient will pick up labs.

## 2019-05-23 ENCOUNTER — Encounter: Payer: Managed Care, Other (non HMO) | Admitting: Family Medicine

## 2019-07-15 ENCOUNTER — Other Ambulatory Visit: Payer: Self-pay | Admitting: Family Medicine

## 2019-08-04 ENCOUNTER — Ambulatory Visit: Payer: Managed Care, Other (non HMO)

## 2019-11-26 ENCOUNTER — Ambulatory Visit: Payer: Managed Care, Other (non HMO) | Admitting: Family Medicine

## 2019-12-11 ENCOUNTER — Encounter: Payer: Self-pay | Admitting: Family Medicine

## 2019-12-11 ENCOUNTER — Other Ambulatory Visit: Payer: Self-pay

## 2019-12-11 ENCOUNTER — Ambulatory Visit (INDEPENDENT_AMBULATORY_CARE_PROVIDER_SITE_OTHER): Payer: Managed Care, Other (non HMO) | Admitting: Family Medicine

## 2019-12-11 VITALS — BP 120/80 | HR 77 | Temp 98.1°F | Ht 75.0 in | Wt 238.4 lb

## 2019-12-11 DIAGNOSIS — G4700 Insomnia, unspecified: Secondary | ICD-10-CM | POA: Diagnosis not present

## 2019-12-11 DIAGNOSIS — R200 Anesthesia of skin: Secondary | ICD-10-CM

## 2019-12-11 NOTE — Patient Instructions (Addendum)
It was very nice to see you today!  I think you had an irritated nerve in your neck. It is better now. Please avoid putitng pressure on the area as much as you can.  Take care, Dr Jimmey Ralph  Please try these tips to maintain a healthy lifestyle:   Eat at least 3 REAL meals and 1-2 snacks per day.  Aim for no more than 5 hours between eating.  If you eat breakfast, please do so within one hour of getting up.    Each meal should contain half fruits/vegetables, one quarter protein, and one quarter carbs (no bigger than a computer mouse)   Cut down on sweet beverages. This includes juice, soda, and sweet tea.     Drink at least 1 glass of water with each meal and aim for at least 8 glasses per day   Exercise at least 150 minutes every week.   Please try to incorporate the following into your daily routine:  1. Sleep only long enough to feel rested and then get out of bed  2. Go to bed and get up at the same time every day  3. Do not try to force yourself to sleep. If you can't sleep, get out of bed and try again later.  4. Have coffee, tea, and other foods that have caffeine only in the morning  5. Avoid alcohol in the late afternoon, evening, and bedtime  6. Avoid smoking, especially in the evening  7. Keep your bedroom dark, cool, quiet, and free of reminders of work or other things that cause you stress  8. Solve problems you have before you go to bed  9. Exercise several days a week, but not right before bed  10. Avoid looking at phones or reading devices ("e-books") that give off light before bed. This can make it harder to fall asleep.

## 2019-12-11 NOTE — Progress Notes (Signed)
   Brent Pineda is a 39 y.o. male who presents today for an office visit.  Assessment/Plan:  New/Acute Problems: Left Arm Numbness Symptoms have resolved.  Likely had mild transient cervical radiculopathy due to sleep positioning.  Discussed activities to avoid and sleep positions to avoid.  Recommend over-the-counter meds if needed if recurs.  Discussed reasons to return to care.  Follow-up as needed.  Chronic Problems Addressed Today: Insomnia Currently manageable.  Discussed sleep hygiene measures.  We will continue using melatonin as needed.     Subjective:  HPI:  Started having left arm numbness at night for the past 3-4 weeks. Numbness was from shoulder all the way down. Symptoms were interrupting his sleep. Doing better today and over the last 4-5 days. No pain. No chest pain or shortness of breath.  He has changes sleep position.  He had been putting a lot of pressure on his left shoulder.  Patient has also had intermittent issues with waking up in the overnight not being able to go back to sleep.  Symptoms have improved over the last week or so.  Has tried using melatonin with some improvement.       Objective:  Physical Exam: BP 120/80   Pulse 77   Temp 98.1 F (36.7 C) (Temporal)   Ht 6\' 3"  (1.905 m)   Wt 238 lb 6.4 oz (108.1 kg)   SpO2 97%   BMI 29.80 kg/m   Gen: No acute distress, resting comfortably MSK:  -Left arm: Negative Tinel's sign at wrist and medial epicondyle.  Spurling test negative.  Full range of motion throughout.  Strength 5 out of 5 throughout.  Neurovascular intact distally.      . Katina Degree, MD 12/11/2019 9:10 AM

## 2019-12-11 NOTE — Assessment & Plan Note (Signed)
Currently manageable.  Discussed sleep hygiene measures.  We will continue using melatonin as needed.

## 2019-12-25 ENCOUNTER — Encounter: Payer: Self-pay | Admitting: Family Medicine

## 2019-12-26 NOTE — Telephone Encounter (Signed)
Please advise 

## 2020-01-08 ENCOUNTER — Ambulatory Visit: Payer: Managed Care, Other (non HMO) | Admitting: Family Medicine

## 2020-01-21 ENCOUNTER — Ambulatory Visit: Payer: Managed Care, Other (non HMO) | Admitting: Family Medicine

## 2020-02-05 ENCOUNTER — Encounter: Payer: Self-pay | Admitting: Family Medicine

## 2020-02-05 ENCOUNTER — Other Ambulatory Visit: Payer: Self-pay

## 2020-02-05 DIAGNOSIS — R200 Anesthesia of skin: Secondary | ICD-10-CM

## 2020-02-05 NOTE — Telephone Encounter (Signed)
I spoke with pt, he is willing to try sports Medicine. I will put in referral.

## 2020-02-11 ENCOUNTER — Other Ambulatory Visit: Payer: Self-pay

## 2020-02-11 ENCOUNTER — Encounter: Payer: Self-pay | Admitting: Family Medicine

## 2020-02-11 ENCOUNTER — Ambulatory Visit (INDEPENDENT_AMBULATORY_CARE_PROVIDER_SITE_OTHER): Payer: Managed Care, Other (non HMO) | Admitting: Family Medicine

## 2020-02-11 VITALS — BP 108/82 | Ht 75.0 in | Wt 235.0 lb

## 2020-02-11 DIAGNOSIS — M5010 Cervical disc disorder with radiculopathy, unspecified cervical region: Secondary | ICD-10-CM

## 2020-02-11 MED ORDER — PREDNISONE 10 MG PO TABS: ORAL_TABLET | ORAL | 0 refills | Status: DC

## 2020-02-11 NOTE — Progress Notes (Addendum)
PCP: Ardith Dark, MD  Subjective:   HPI: Patient is a 39 y.o. male here for left arm numbness.  Mr. Brent Pineda states that his symptoms began approximately 3 months ago after sitting in a Jacuzzi for several hours with his arms up, without moving. He had noticed some tenderness along his left upper shoulder extending into his left arm down to the left forearm. Since that time, he has sporadically been experiencing similar symptoms along with numbness and tingling in the same area. The numbness and tingling radiate into his arm down to the mid forearm; patient states it feels fully circumferential. He denies any involvement of the wrist or hand. He notes that the pain/paresthesia mostly occur at night, approximately 2-3 times per week. It has significantly affected his sleep quality as the pain is quite severe. He is unsure what position he is sleeping and when the pain occurs. Not found any particular motions that exacerbate the pain.he has tried taking ibuprofen but has not found any relief with it. He denies any neck pain, back pain, muscle weakness, trauma. Approximately 1 week ago he did start yoga and has not experienced any more episodes since.  Past Medical History:  Diagnosis Date  . HLD (hyperlipidemia)   . Low vitamin D level    No current outpatient medications on file prior to visit.   No current facility-administered medications on file prior to visit.   No past surgical history on file.  No Known Allergies  Social History   Socioeconomic History  . Marital status: Married    Spouse name: Not on file  . Number of children: Not on file  . Years of education: Not on file  . Highest education level: Not on file  Occupational History  . Not on file  Tobacco Use  . Smoking status: Never Smoker  . Smokeless tobacco: Never Used  Substance and Sexual Activity  . Alcohol use: Yes    Comment: occasionally  . Drug use: No  . Sexual activity: Yes  Other Topics Concern  .  Not on file  Social History Narrative  . Not on file   Social Determinants of Health   Financial Resource Strain:   . Difficulty of Paying Living Expenses: Not on file  Food Insecurity:   . Worried About Programme researcher, broadcasting/film/video in the Last Year: Not on file  . Ran Out of Food in the Last Year: Not on file  Transportation Needs:   . Lack of Transportation (Medical): Not on file  . Lack of Transportation (Non-Medical): Not on file  Physical Activity:   . Days of Exercise per Week: Not on file  . Minutes of Exercise per Session: Not on file  Stress:   . Feeling of Stress : Not on file  Social Connections:   . Frequency of Communication with Friends and Family: Not on file  . Frequency of Social Gatherings with Friends and Family: Not on file  . Attends Religious Services: Not on file  . Active Member of Clubs or Organizations: Not on file  . Attends Banker Meetings: Not on file  . Marital Status: Not on file  Intimate Partner Violence:   . Fear of Current or Ex-Partner: Not on file  . Emotionally Abused: Not on file  . Physically Abused: Not on file  . Sexually Abused: Not on file    No family history on file.  BP 108/82   Ht 6\' 3"  (1.905 m)   Wt 235  lb (106.6 kg)   BMI 29.37 kg/m   Review of Systems: See HPI above.     Objective:  Physical Exam:  Gen: NAD, comfortable in exam room  Neck: No gross deformity, swelling, bruising. Left trapezius tenderness, mild.  No midline/bony TTP. FROM. BUE strength 5/5.   Sensation intact to light touch.   2+ equal reflexes in triceps, biceps, brachioradialis tendons. Negative spurlings. NV intact distal BUEs.  Left shoulder: No gross deformities on inspection. Mild tenderness to palpation along the superior trapezius in addition to the anterior joint line of the shoulder. Full range of motion intact without any pain. 5 out of 5 strength with flexion, extension, abduction internal rotation and external rotation.  Negative empty can test, Hawkins test and Neer test. Sensation intact.   Left forearm: No gross abnormality on inspection. No tenderness to palpation. Sensation intact  Left fingers: No gross abnormality on inspection. 5 out of 5 strength with abduction. Pinch grip intact.  Assessment & Plan:  1. Neck pain - with radiation into left upper extremity.  Consistent with cervical radiculopathy, likely involving C5 or C6. Will begin with Prednisone taper today for 6 days. Instructed patient to let us know in 2-3 weeks how he's doing to discuss additional options, including PT versus imaging.

## 2020-02-11 NOTE — Patient Instructions (Signed)
Happy early birthday! You have cervical radiculopathy (a pinched nerve in the neck). Prednisone 6 day dose pack to relieve irritation/inflammation of the nerve. Don't take ibuprofen while on the prednisone. Consider cervical collar if severely painful. Simple range of motion exercises within limits of pain to prevent further stiffness. Consider physical therapy for stretching, exercises, traction, and modalities if you don't improve with the medicine. Heat 15 minutes at a time 3-4 times a day to help with spasms. Watch head position when on computers, texting, when sleeping in bed - should in line with back to prevent further nerve traction and irritation. If not improving we will consider an MRI, physical therapy. Contact me in 2-3 weeks with an update (mychart or call).

## 2020-02-18 ENCOUNTER — Ambulatory Visit: Payer: Managed Care, Other (non HMO) | Admitting: Family Medicine

## 2020-03-05 IMAGING — DX DG SACRUM/COCCYX 2+V
3 series · 3 of 3 positions shown · non-contrast
Comparison: None.

CLINICAL DATA: The patient fell five days ago landing on his
buttocks. Persistent tailbone pain.

EXAM:
SACRUM AND COCCYX - 2+ VIEW

[lumbar spine ap]
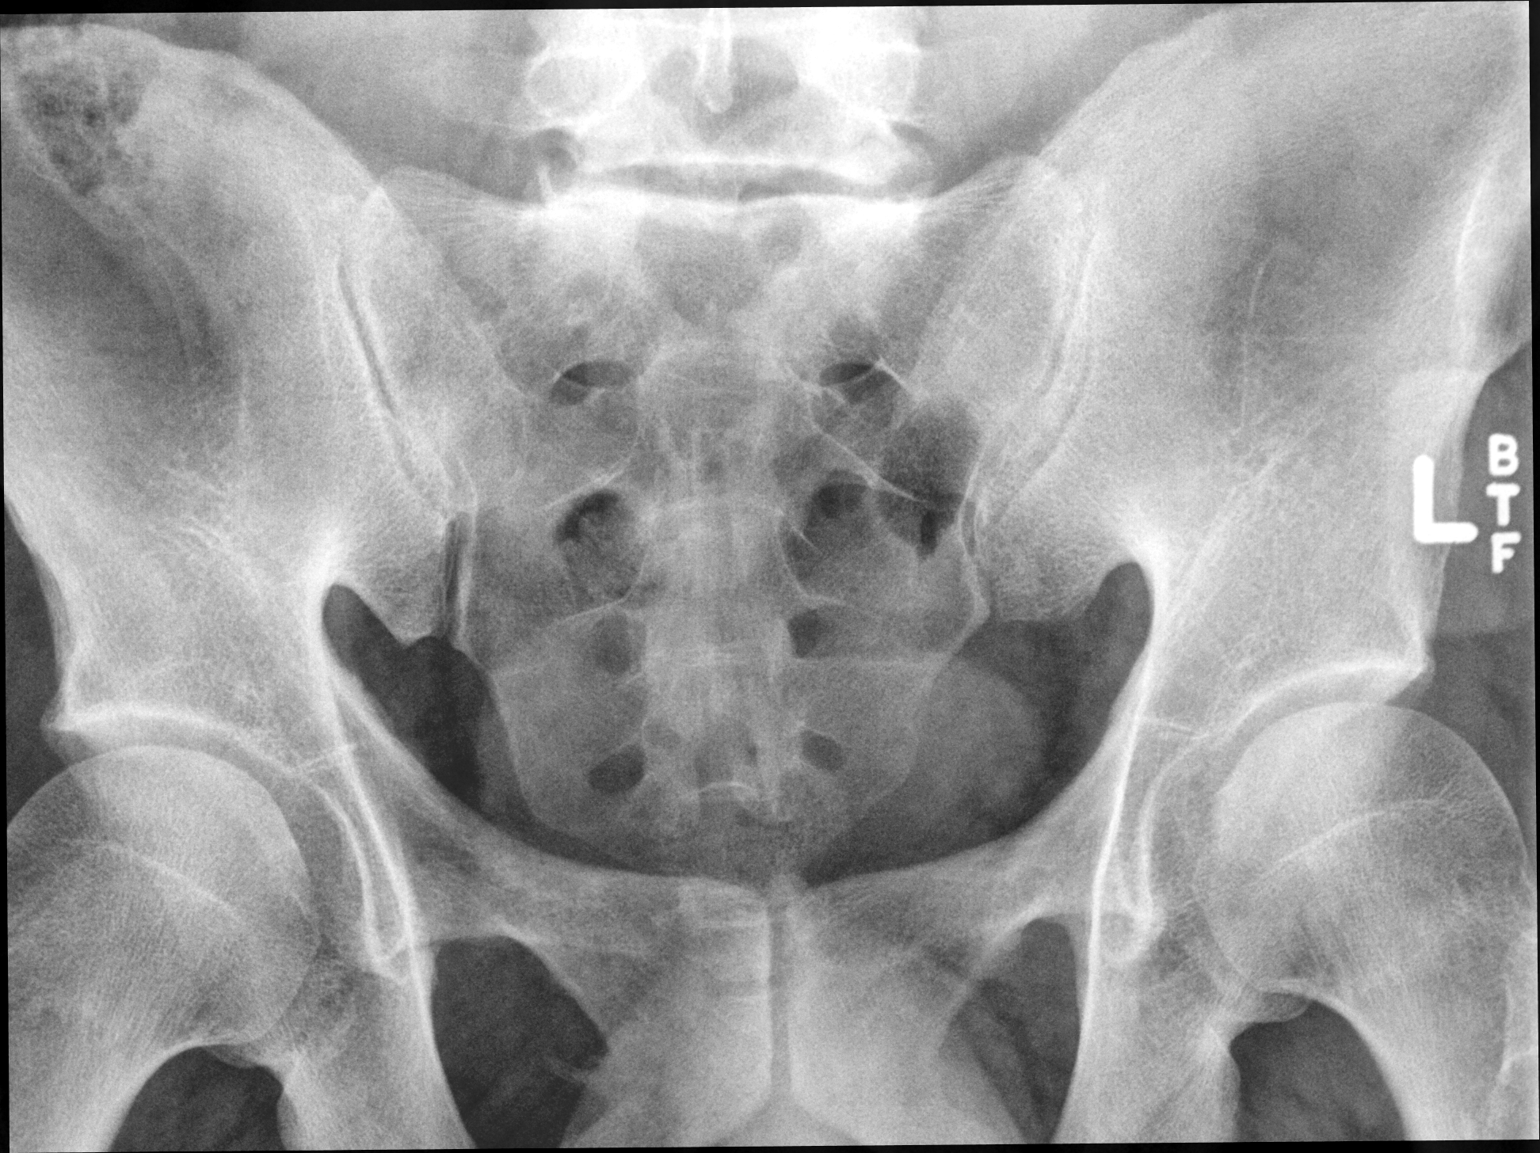

[lumbar spine lat (1 of 2)]
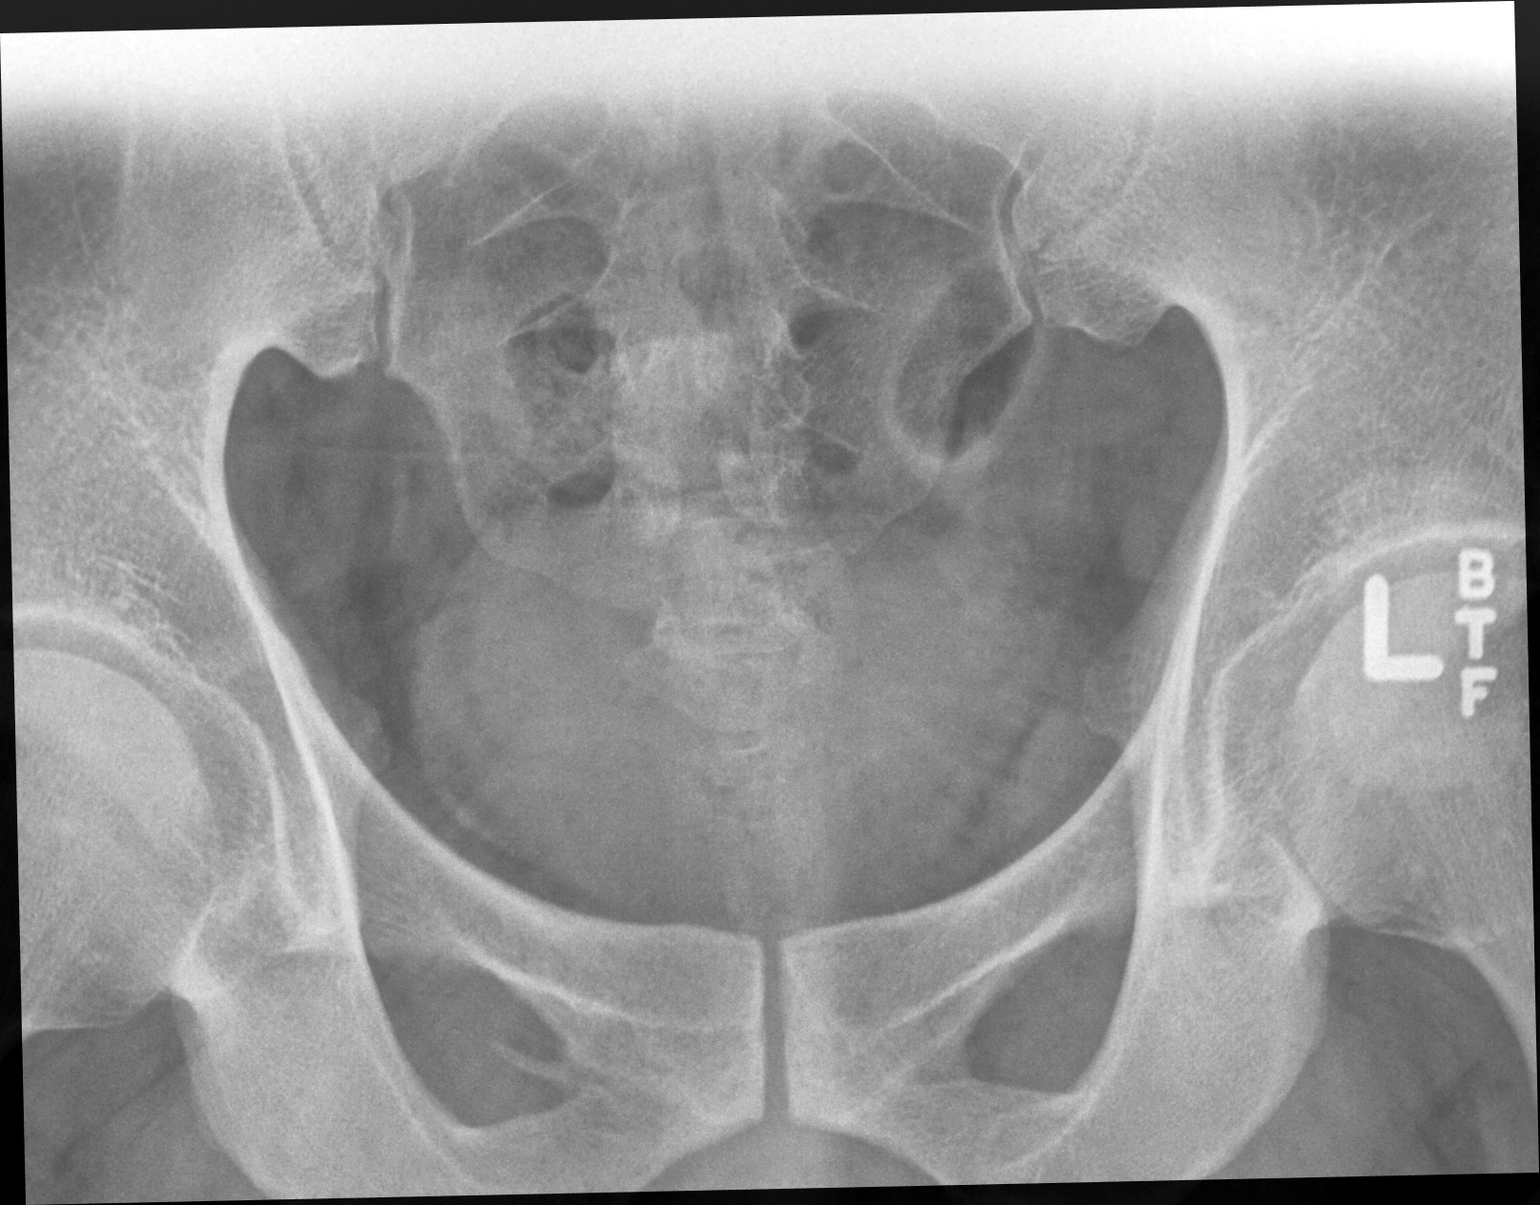

[lumbar spine lat (2 of 2)]
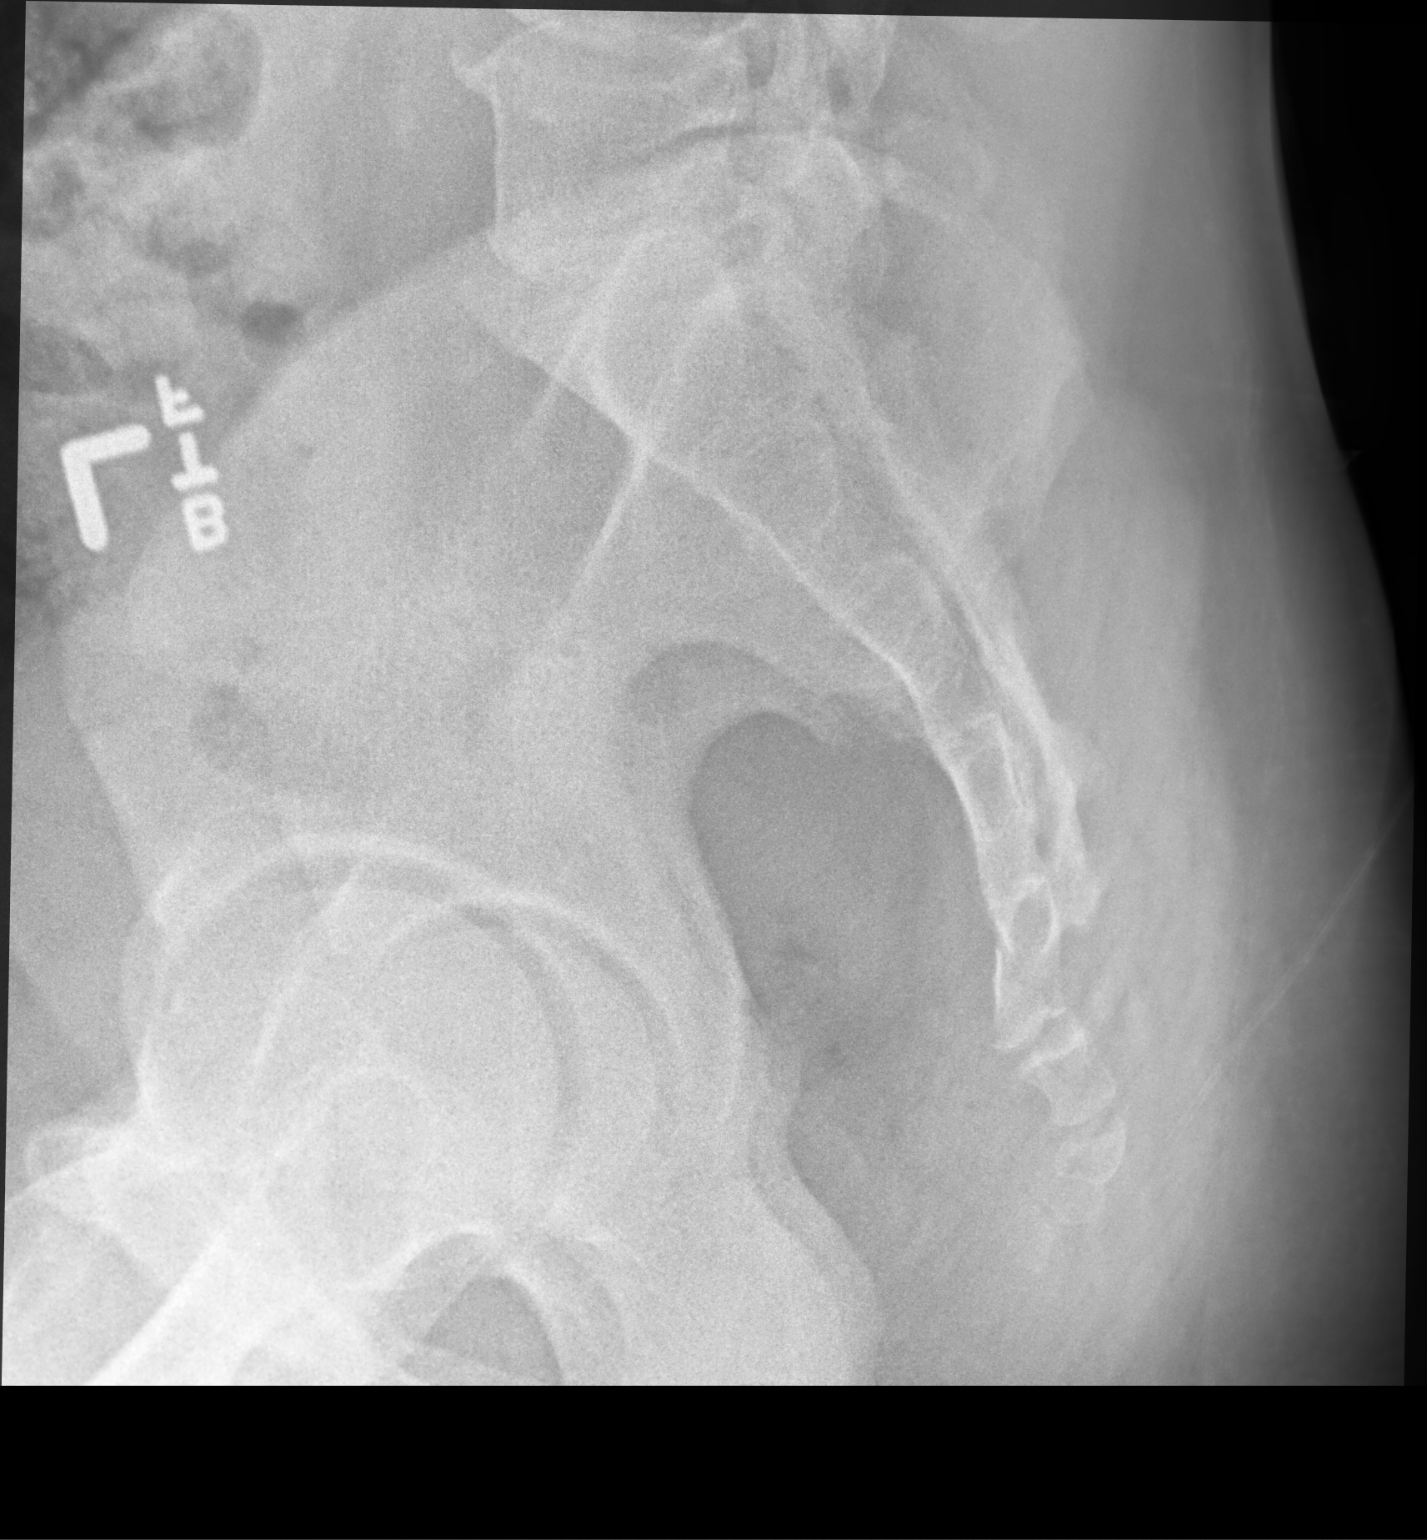

[3 of 3 positions shown; findings below may reference images not displayed]

FINDINGS: The sacrum is subjectively adequately mineralized. There are at
least 4 intact sacral struts visible. The SI joints are
unremarkable. On the lateral view there is cortical irregularity of
the tip of the fifth sacral segment consistent with an acute
fracture. The coccyx appears intact. The presacral soft tissues are
grossly normal..
IMPRESSION: The patient has sustained an acute slightly angulated fracture
through the fifth sacral segment.

## 2020-05-01 ENCOUNTER — Other Ambulatory Visit: Payer: Self-pay

## 2020-05-28 ENCOUNTER — Encounter: Payer: Self-pay | Admitting: Family Medicine

## 2020-05-28 ENCOUNTER — Other Ambulatory Visit: Payer: Self-pay

## 2020-05-28 ENCOUNTER — Ambulatory Visit (INDEPENDENT_AMBULATORY_CARE_PROVIDER_SITE_OTHER): Payer: Managed Care, Other (non HMO) | Admitting: Family Medicine

## 2020-05-28 VITALS — BP 117/74 | HR 77 | Temp 97.8°F | Ht 75.0 in | Wt 231.0 lb

## 2020-05-28 DIAGNOSIS — E538 Deficiency of other specified B group vitamins: Secondary | ICD-10-CM | POA: Insufficient documentation

## 2020-05-28 DIAGNOSIS — Z136 Encounter for screening for cardiovascular disorders: Secondary | ICD-10-CM | POA: Diagnosis not present

## 2020-05-28 DIAGNOSIS — E782 Mixed hyperlipidemia: Secondary | ICD-10-CM | POA: Diagnosis not present

## 2020-05-28 DIAGNOSIS — E663 Overweight: Secondary | ICD-10-CM

## 2020-05-28 DIAGNOSIS — Z6828 Body mass index (BMI) 28.0-28.9, adult: Secondary | ICD-10-CM

## 2020-05-28 DIAGNOSIS — Z0001 Encounter for general adult medical examination with abnormal findings: Secondary | ICD-10-CM

## 2020-05-28 DIAGNOSIS — R7989 Other specified abnormal findings of blood chemistry: Secondary | ICD-10-CM | POA: Diagnosis not present

## 2020-05-28 DIAGNOSIS — Z23 Encounter for immunization: Secondary | ICD-10-CM

## 2020-05-28 NOTE — Assessment & Plan Note (Signed)
Check vitamin D level 

## 2020-05-28 NOTE — Patient Instructions (Signed)
It was very nice to see you today!  Please work on the exercises for your neck.  We give you a flu vaccine today.  We will check blood work and urine sample.  I will see you back in year for your next annual physical.  Please come back to see me sooner if needed.  Take care, Dr Jerline Pain  Please try these tips to maintain a healthy lifestyle:   Eat at least 3 REAL meals and 1-2 snacks per day.  Aim for no more than 5 hours between eating.  If you eat breakfast, please do so within one hour of getting up.    Each meal should contain half fruits/vegetables, one quarter protein, and one quarter carbs (no bigger than a computer mouse)   Cut down on sweet beverages. This includes juice, soda, and sweet tea.     Drink at least 1 glass of water with each meal and aim for at least 8 glasses per day   Exercise at least 150 minutes every week.    Preventive Care 1-46 Years Old, Male Preventive care refers to lifestyle choices and visits with your health care provider that can promote health and wellness. This includes:  A yearly physical exam. This is also called an annual well check.  Regular dental and eye exams.  Immunizations.  Screening for certain conditions.  Healthy lifestyle choices, such as eating a healthy diet, getting regular exercise, not using drugs or products that contain nicotine and tobacco, and limiting alcohol use. What can I expect for my preventive care visit? Physical exam Your health care provider will check:  Height and weight. These may be used to calculate body mass index (BMI), which is a measurement that tells if you are at a healthy weight.  Heart rate and blood pressure.  Your skin for abnormal spots. Counseling Your health care provider may ask you questions about:  Alcohol, tobacco, and drug use.  Emotional well-being.  Home and relationship well-being.  Sexual activity.  Eating habits.  Work and work Statistician. What  immunizations do I need?  Influenza (flu) vaccine  This is recommended every year. Tetanus, diphtheria, and pertussis (Tdap) vaccine  You may need a Td booster every 10 years. Varicella (chickenpox) vaccine  You may need this vaccine if you have not already been vaccinated. Human papillomavirus (HPV) vaccine  If recommended by your health care provider, you may need three doses over 6 months. Measles, mumps, and rubella (MMR) vaccine  You may need at least one dose of MMR. You may also need a second dose. Meningococcal conjugate (MenACWY) vaccine  One dose is recommended if you are 31-72 years old and a Market researcher living in a residence hall, or if you have one of several medical conditions. You may also need additional booster doses. Pneumococcal conjugate (PCV13) vaccine  You may need this if you have certain conditions and were not previously vaccinated. Pneumococcal polysaccharide (PPSV23) vaccine  You may need one or two doses if you smoke cigarettes or if you have certain conditions. Hepatitis A vaccine  You may need this if you have certain conditions or if you travel or work in places where you may be exposed to hepatitis A. Hepatitis B vaccine  You may need this if you have certain conditions or if you travel or work in places where you may be exposed to hepatitis B. Haemophilus influenzae type b (Hib) vaccine  You may need this if you have certain risk factors. You  may receive vaccines as individual doses or as more than one vaccine together in one shot (combination vaccines). Talk with your health care provider about the risks and benefits of combination vaccines. What tests do I need? Blood tests  Lipid and cholesterol levels. These may be checked every 5 years starting at age 67.  Hepatitis C test.  Hepatitis B test. Screening   Diabetes screening. This is done by checking your blood sugar (glucose) after you have not eaten for a while  (fasting).  Sexually transmitted disease (STD) testing. Talk with your health care provider about your test results, treatment options, and if necessary, the need for more tests. Follow these instructions at home: Eating and drinking   Eat a diet that includes fresh fruits and vegetables, whole grains, lean protein, and low-fat dairy products.  Take vitamin and mineral supplements as recommended by your health care provider.  Do not drink alcohol if your health care provider tells you not to drink.  If you drink alcohol: ? Limit how much you have to 0-2 drinks a day. ? Be aware of how much alcohol is in your drink. In the [place] one drink equals one 12 oz bottle of beer (355 mL), one 5 oz glass of wine (148 mL), or one 1 oz glass of hard liquor (44 mL). Lifestyle  Take daily care of your teeth and gums.  Stay active. Exercise for at least 30 minutes on 5 or more days each week.  Do not use any products that contain nicotine or tobacco, such as cigarettes, e-cigarettes, and chewing tobacco. If you need help quitting, ask your health care provider.  If you are sexually active, practice safe sex. Use a condom or other form of protection to prevent STIs (sexually transmitted infections). What's next?  Go to your health care provider once a year for a well check visit.  Ask your health care provider how often you should have your eyes and teeth checked.  Stay up to date on all vaccines. This information is not intended to replace advice given to you by your health care provider. Make sure you discuss any questions you have with your health care provider. Document Revised: 06/01/2018 Document Reviewed: 06/01/2018 Elsevier Patient Education  2020 [employer].

## 2020-05-28 NOTE — Assessment & Plan Note (Signed)
Check CBC, CMET, TSH, lipid panel.  Discussed lifestyle modification.

## 2020-05-28 NOTE — Progress Notes (Signed)
Chief Complaint:  Brent Pineda is a 39 y.o. male who presents today for his annual comprehensive physical exam.    Assessment/Plan:  New/Acute Problems: Neck Pain No red flags.  Discussed home exercises and handout was given.  May need referral back with sports medicine if continues to be problematic.  Chronic Problems Addressed Today: Vitamin B12 deficiency Check B12.  HLD (hyperlipidemia) Check CBC, CMET, TSH, lipid panel.  Discussed lifestyle modification.  Low vitamin D level Check vitamin D level.   Body mass index is 28.87 kg/m. / Overweight  BMI Metric Follow Up - 05/28/20 1042      BMI Metric Follow Up-Please document annually   BMI Metric Follow Up Education provided            Preventative Healthcare: Flu vaccine given today. Check CBC, CMET, TSH, lipids, Vit D, B12, and EKG.   Patient Counseling(The following topics were reviewed and/or handout was given):  -Nutrition: Stressed importance of moderation in sodium/caffeine intake, saturated fat and cholesterol, caloric balance, sufficient intake of fresh fruits, vegetables, and fiber.  -Stressed the importance of regular exercise.   -Substance Abuse: Discussed cessation/primary prevention of tobacco, alcohol, or other drug use; driving or other dangerous activities under the influence; availability of treatment for abuse.   -Injury prevention: Discussed safety belts, safety helmets, smoke detector, smoking near bedding or upholstery.   -Sexuality: Discussed sexually transmitted diseases, partner selection, use of condoms, avoidance of unintended pregnancy and contraceptive alternatives.   -Dental health: Discussed importance of regular tooth brushing, flossing, and dental visits.  -Health maintenance and immunizations reviewed. Please refer to Health maintenance section.  Return to care in 1 year for next preventative visit.     Subjective:  HPI:  He has no acute complaints today.   Lifestyle Diet:  Working on cutting down portions.  Exercise: Walks daily.   Depression screen PHQ 2/9 05/28/2020  Decreased Interest -  Down, Depressed, Hopeless 0  PHQ - 2 Score 0    Health Maintenance Due  Topic Date Due  . Hepatitis C Screening  Never done  . TETANUS/TDAP  Never done     ROS: Per HPI, otherwise a complete review of systems was negative.   PMH:  The following were reviewed and entered/updated in epic: Past Medical History:  Diagnosis Date  . HLD (hyperlipidemia)   . Low vitamin D level    Patient Active Problem List   Diagnosis Date Noted  . Vitamin B12 deficiency 05/28/2020  . Insomnia 12/11/2019  . Low vitamin D level   . HLD (hyperlipidemia)    History reviewed. No pertinent surgical history.  History reviewed. No pertinent family history.  Medications- reviewed and updated No current outpatient medications on file.   No current facility-administered medications for this visit.    Allergies-reviewed and updated No Known Allergies  Social History   Socioeconomic History  . Marital status: Married    Spouse name: Not on file  . Number of children: Not on file  . Years of education: Not on file  . Highest education level: Not on file  Occupational History  . Not on file  Tobacco Use  . Smoking status: Never Smoker  . Smokeless tobacco: Never Used  Substance and Sexual Activity  . Alcohol use: Yes    Comment: occasionally  . Drug use: No  . Sexual activity: Yes  Other Topics Concern  . Not on file  Social History Narrative  . Not on file   Social Determinants  of Health   Financial Resource Strain:   . Difficulty of Paying Living Expenses: Not on file  Food Insecurity:   . Worried About Programme researcher, broadcasting/film/video in the Last Year: Not on file  . Ran Out of Food in the Last Year: Not on file  Transportation Needs:   . Lack of Transportation (Medical): Not on file  . Lack of Transportation (Non-Medical): Not on file  Physical Activity:   . Days of  Exercise per Week: Not on file  . Minutes of Exercise per Session: Not on file  Stress:   . Feeling of Stress : Not on file  Social Connections:   . Frequency of Communication with Friends and Family: Not on file  . Frequency of Social Gatherings with Friends and Family: Not on file  . Attends Religious Services: Not on file  . Active Member of Clubs or Organizations: Not on file  . Attends Banker Meetings: Not on file  . Marital Status: Not on file        Objective:  Physical Exam: BP 117/74   Pulse 77   Temp 97.8 F (36.6 C) (Temporal)   Ht 6\' 3"  (1.905 m)   Wt 231 lb (104.8 kg)   SpO2 100%   BMI 28.87 kg/m   Body mass index is 28.87 kg/m. Wt Readings from Last 3 Encounters:  05/28/20 231 lb (104.8 kg)  02/11/20 235 lb (106.6 kg)  12/11/19 238 lb 6.4 oz (108.1 kg)  Gen: NAD, resting comfortably HEENT: TMs normal bilaterally. OP clear. No thyromegaly noted.  CV: RRR with no murmurs appreciated Pulm: NWOB, CTAB with no crackles, wheezes, or rhonchi GI: Normal bowel sounds present. Soft, Nontender, Nondistended. MSK: no edema, cyanosis, or clubbing noted Skin: warm, dry Neuro: CN2-12 grossly intact. Strength 5/5 in upper and lower extremities. Reflexes symmetric and intact bilaterally.  Psych: Normal affect and thought content  EKG: NSR. No signs of ischemic changes.      12/13/19. Katina Degree, MD 05/28/2020 10:42 AM

## 2020-05-28 NOTE — Assessment & Plan Note (Signed)
Check B12 

## 2020-05-29 ENCOUNTER — Encounter: Payer: Self-pay | Admitting: Family Medicine

## 2020-05-29 LAB — CBC
HCT: 45.9 % (ref 38.5–50.0)
Hemoglobin: 15.5 g/dL (ref 13.2–17.1)
MCH: 29 pg (ref 27.0–33.0)
MCHC: 33.8 g/dL (ref 32.0–36.0)
MCV: 86 fL (ref 80.0–100.0)
MPV: 12.8 fL — ABNORMAL HIGH (ref 7.5–12.5)
Platelets: 250 10*3/uL (ref 140–400)
RBC: 5.34 10*6/uL (ref 4.20–5.80)
RDW: 12.9 % (ref 11.0–15.0)
WBC: 6.8 10*3/uL (ref 3.8–10.8)

## 2020-05-29 LAB — URINALYSIS, ROUTINE W REFLEX MICROSCOPIC
Bilirubin Urine: NEGATIVE
Glucose, UA: NEGATIVE
Hgb urine dipstick: NEGATIVE
Ketones, ur: NEGATIVE
Leukocytes,Ua: NEGATIVE
Nitrite: NEGATIVE
Protein, ur: NEGATIVE
Specific Gravity, Urine: 1.012 (ref 1.001–1.03)
pH: 6 (ref 5.0–8.0)

## 2020-05-29 LAB — COMPREHENSIVE METABOLIC PANEL
AG Ratio: 1.5 (calc) (ref 1.0–2.5)
ALT: 18 U/L (ref 9–46)
AST: 19 U/L (ref 10–40)
Albumin: 4.8 g/dL (ref 3.6–5.1)
Alkaline phosphatase (APISO): 77 U/L (ref 36–130)
BUN: 14 mg/dL (ref 7–25)
CO2: 29 mmol/L (ref 20–32)
Calcium: 10.4 mg/dL — ABNORMAL HIGH (ref 8.6–10.3)
Chloride: 102 mmol/L (ref 98–110)
Creat: 0.69 mg/dL (ref 0.60–1.35)
Globulin: 3.1 g/dL (calc) (ref 1.9–3.7)
Glucose, Bld: 88 mg/dL (ref 65–99)
Potassium: 4.6 mmol/L (ref 3.5–5.3)
Sodium: 138 mmol/L (ref 135–146)
Total Bilirubin: 0.7 mg/dL (ref 0.2–1.2)
Total Protein: 7.9 g/dL (ref 6.1–8.1)

## 2020-05-29 LAB — VITAMIN D 25 HYDROXY (VIT D DEFICIENCY, FRACTURES): Vit D, 25-Hydroxy: 20 ng/mL — ABNORMAL LOW (ref 30–100)

## 2020-05-29 LAB — LIPID PANEL
Cholesterol: 213 mg/dL — ABNORMAL HIGH (ref <200)
HDL: 38 mg/dL — ABNORMAL LOW (ref >=40)
LDL Cholesterol (Calc): 141 mg/dL (calc) — ABNORMAL HIGH
Non-HDL Cholesterol (Calc): 175 mg/dL (calc) — ABNORMAL HIGH (ref <130)
Total CHOL/HDL Ratio: 5.6 (calc) — ABNORMAL HIGH (ref <5.0)
Triglycerides: 197 mg/dL — ABNORMAL HIGH (ref <150)

## 2020-05-29 LAB — TSH: TSH: 1.26 mIU/L (ref 0.40–4.50)

## 2020-05-29 LAB — VITAMIN B12: Vitamin B-12: 273 pg/mL (ref 200–1100)

## 2020-05-29 NOTE — Progress Notes (Signed)
Please inform patient of the following:  Vitamin d is low. Would like for him to take 50000IU weekly. Please send in rx. We should recheck in 3-6 months.  Cholesterol levels are borderline but stable. He should keep working on diet and exercise and we can recheck in a year or so.  Everything else is stable.  Katina Degree. Jimmey Ralph, MD 05/29/2020 9:03 AM

## 2020-05-30 ENCOUNTER — Other Ambulatory Visit: Payer: Self-pay | Admitting: *Deleted

## 2020-05-30 MED ORDER — VITAMIN D (ERGOCALCIFEROL) 1.25 MG (50000 UNIT) PO CAPS: 50000.0000 [IU] | ORAL_CAPSULE | ORAL | 0 refills | Status: DC

## 2020-05-30 NOTE — Progress Notes (Signed)
Vit  

## 2020-06-05 ENCOUNTER — Encounter: Payer: Self-pay | Admitting: Family Medicine

## 2020-06-06 NOTE — Telephone Encounter (Signed)
See note below

## 2020-06-17 ENCOUNTER — Ambulatory Visit: Payer: Managed Care, Other (non HMO) | Admitting: Family Medicine

## 2020-06-27 DIAGNOSIS — Z20822 Contact with and (suspected) exposure to covid-19: Secondary | ICD-10-CM | POA: Diagnosis not present

## 2020-06-27 DIAGNOSIS — Z03818 Encounter for observation for suspected exposure to other biological agents ruled out: Secondary | ICD-10-CM | POA: Diagnosis not present

## 2020-08-17 ENCOUNTER — Other Ambulatory Visit: Payer: Self-pay | Admitting: Family Medicine

## 2021-06-01 ENCOUNTER — Ambulatory Visit (INDEPENDENT_AMBULATORY_CARE_PROVIDER_SITE_OTHER): Payer: BC Managed Care – PPO | Admitting: Family Medicine

## 2021-06-01 ENCOUNTER — Encounter: Payer: Self-pay | Admitting: Family Medicine

## 2021-06-01 ENCOUNTER — Other Ambulatory Visit: Payer: Self-pay

## 2021-06-01 VITALS — BP 107/74 | HR 77 | Temp 98.1°F | Ht 75.0 in | Wt 220.8 lb

## 2021-06-01 DIAGNOSIS — Z0001 Encounter for general adult medical examination with abnormal findings: Secondary | ICD-10-CM | POA: Diagnosis not present

## 2021-06-01 DIAGNOSIS — R739 Hyperglycemia, unspecified: Secondary | ICD-10-CM

## 2021-06-01 DIAGNOSIS — Z23 Encounter for immunization: Secondary | ICD-10-CM

## 2021-06-01 DIAGNOSIS — E782 Mixed hyperlipidemia: Secondary | ICD-10-CM

## 2021-06-01 DIAGNOSIS — Z0000 Encounter for general adult medical examination without abnormal findings: Secondary | ICD-10-CM

## 2021-06-01 DIAGNOSIS — R7989 Other specified abnormal findings of blood chemistry: Secondary | ICD-10-CM

## 2021-06-01 DIAGNOSIS — E538 Deficiency of other specified B group vitamins: Secondary | ICD-10-CM

## 2021-06-01 LAB — CBC
HCT: 42.7 % (ref 39.0–52.0)
Hemoglobin: 14.3 g/dL (ref 13.0–17.0)
MCHC: 33.6 g/dL (ref 30.0–36.0)
MCV: 87.2 fl (ref 78.0–100.0)
Platelets: 238 10*3/uL (ref 150.0–400.0)
RBC: 4.89 Mil/uL (ref 4.22–5.81)
RDW: 13.4 % (ref 11.5–15.5)
WBC: 7.6 10*3/uL (ref 4.0–10.5)

## 2021-06-01 LAB — COMPREHENSIVE METABOLIC PANEL
ALT: 12 U/L (ref 0–53)
AST: 14 U/L (ref 0–37)
Albumin: 4.2 g/dL (ref 3.5–5.2)
Alkaline Phosphatase: 69 U/L (ref 39–117)
BUN: 14 mg/dL (ref 6–23)
CO2: 28 mEq/L (ref 19–32)
Calcium: 10 mg/dL (ref 8.4–10.5)
Chloride: 101 mEq/L (ref 96–112)
Creatinine, Ser: 0.68 mg/dL (ref 0.40–1.50)
GFR: 117.26 mL/min (ref >60.00)
Glucose, Bld: 93 mg/dL (ref 70–99)
Potassium: 4.5 mEq/L (ref 3.5–5.1)
Sodium: 137 mEq/L (ref 135–145)
Total Bilirubin: 0.7 mg/dL (ref 0.2–1.2)
Total Protein: 7.3 g/dL (ref 6.0–8.3)

## 2021-06-01 LAB — LIPID PANEL
Cholesterol: 213 mg/dL — ABNORMAL HIGH (ref 0–200)
HDL: 33.9 mg/dL — ABNORMAL LOW (ref >39.00)
NonHDL: 179.03
Total CHOL/HDL Ratio: 6
Triglycerides: 311 mg/dL — ABNORMAL HIGH (ref 0.0–149.0)
VLDL: 62.2 mg/dL — ABNORMAL HIGH (ref 0.0–40.0)

## 2021-06-01 LAB — LDL CHOLESTEROL, DIRECT: Direct LDL: 137 mg/dL

## 2021-06-01 LAB — TSH: TSH: 1.97 u[IU]/mL (ref 0.35–5.50)

## 2021-06-01 LAB — HEMOGLOBIN A1C: Hgb A1c MFr Bld: 5.9 % (ref 4.6–6.5)

## 2021-06-01 LAB — VITAMIN D 25 HYDROXY (VIT D DEFICIENCY, FRACTURES): VITD: 9.59 ng/mL — ABNORMAL LOW (ref 30.00–100.00)

## 2021-06-01 LAB — VITAMIN B12: Vitamin B-12: 152 pg/mL — ABNORMAL LOW (ref 211–911)

## 2021-06-01 NOTE — Assessment & Plan Note (Signed)
Check B12 

## 2021-06-01 NOTE — Patient Instructions (Signed)
It was very nice to see you today!  We will check blood work today.  Please continue working on diet and exercise.  We will see back in 1 year for your next physical.  Please come back sooner if needed.  Take care, Dr Jimmey Ralph  PLEASE NOTE:  If you had any lab tests please let us know if you have not heard back within a few days. You may see your results on mychart before we have a chance to review them but we will give you a call once they are reviewed by [place]. If we ordered any referrals today, please let us know if you have not heard from their office within the next week.   Please try these tips to maintain a healthy lifestyle:  Eat at least 3 REAL meals and 1-2 snacks per day.  Aim for no more than 5 hours between eating.  If you eat breakfast, please do so within one hour of getting up.   Each meal should contain half fruits/vegetables, one quarter protein, and one quarter carbs (no bigger than a computer mouse)  Cut down on sweet beverages. This includes juice, soda, and sweet tea.   Drink at least 1 glass of water with each meal and aim for at least 8 glasses per day  Exercise at least 150 minutes every week.    Preventive Care 1-32 Years Old, Male Preventive care refers to lifestyle choices and visits with your health care provider that can promote health and wellness. Preventive care visits are also called wellness exams. What can I expect for my preventive care visit? Counseling During your preventive care visit, your health care provider may ask about your: Medical history, including: Past medical problems. Family medical history. Current health, including: Emotional well-being. Home life and relationship well-being. Sexual activity. Lifestyle, including: Alcohol, nicotine or tobacco, and drug use. Access to firearms. Diet, exercise, and sleep habits. Safety issues such as seatbelt and bike helmet use. Sunscreen use. Work and work Astronomer. Physical  exam Your health care provider may check your: Height and weight. These may be used to calculate your BMI (body mass index). BMI is a measurement that tells if you are at a healthy weight. Waist circumference. This measures the distance around your waistline. This measurement also tells if you are at a healthy weight and may help predict your risk of certain diseases, such as type 2 diabetes and high blood pressure. Heart rate and blood pressure. Body temperature. Skin for abnormal spots. What immunizations do I need? Vaccines are usually given at various ages, according to a schedule. Your health care provider will recommend vaccines for you based on your age, medical history, and lifestyle or other factors, such as travel or where you work. What tests do I need? Screening Your health care provider may recommend screening tests for certain conditions. This may include: Lipid and cholesterol levels. Diabetes screening. This is done by checking your blood sugar (glucose) after you have not eaten for a while (fasting). Hepatitis B test. Hepatitis C test. HIV (human immunodeficiency virus) test. STI (sexually transmitted infection) testing, if you are at risk. Talk with your health care provider about your test results, treatment options, and if necessary, the need for more tests. Follow these instructions at home: Eating and drinking  Eat a healthy diet that includes fresh fruits and vegetables, whole grains, lean protein, and low-fat dairy products. Drink enough fluid to keep your urine pale yellow. Take vitamin and mineral supplements as recommended  by your health care provider. Do not drink alcohol if your health care provider tells you not to drink. If you drink alcohol: Limit how much you have to 0-2 drinks a day. Know how much alcohol is in your drink. In the [place] one drink equals one 12 oz bottle of beer (355 mL), one 5 oz glass of wine (148 mL), or one 1 oz glass of hard liquor  (44 mL). Lifestyle Brush your teeth every morning and night with fluoride toothpaste. Floss one time each day. Exercise for at least 30 minutes 5 or more days each week. Do not use any products that contain nicotine or tobacco. These products include cigarettes, chewing tobacco, and vaping devices, such as e-cigarettes. If you need help quitting, ask your health care provider. Do not use drugs. If you are sexually active, practice safe sex. Use a condom or other form of protection to prevent STIs. Find healthy ways to manage stress, such as: Meditation, yoga, or listening to music. Journaling. Talking to a trusted person. Spending time with friends and family. Minimize exposure to UV radiation to reduce your risk of skin cancer. Safety Always wear your seat belt while driving or riding in a vehicle. Do not drive: If you have been drinking alcohol. Do not ride with someone who has been drinking. If you have been using any mind-altering substances or drugs. While texting. When you are tired or distracted. Wear a helmet and other protective equipment during sports activities. If you have firearms in your house, make sure you follow all gun safety procedures. Seek help if you have been physically or sexually abused. What's next? Go to your health care provider once a year for an annual wellness visit. Ask your health care provider how often you should have your eyes and teeth checked. Stay up to date on all vaccines. This information is not intended to replace advice given to you by your health care provider. Make sure you discuss any questions you have with your health care provider. Document Revised: 12/03/2020 Document Reviewed: 12/03/2020 Elsevier Patient Education  2022 [employer].

## 2021-06-01 NOTE — Assessment & Plan Note (Signed)
Check lipids. Discussed lifestyle modifications.  

## 2021-06-01 NOTE — Assessment & Plan Note (Signed)
Check vitamin D. 

## 2021-06-01 NOTE — Addendum Note (Signed)
Addended by: Dyann Kief on: 06/01/2021 09:30 AM   Modules accepted: Orders

## 2021-06-01 NOTE — Progress Notes (Signed)
Chief Complaint:  Brent Pineda is a 40 y.o. male who presents today for his annual comprehensive physical exam.    Assessment/Plan:  Chronic Problems Addressed Today: Vitamin B12 deficiency Check B12.   HLD (hyperlipidemia) Check lipids.  Discussed lifestyle modifications.  Low vitamin D level Check vitamin D.   Body mass index is 27.6 kg/m. / Overweight  BMI Metric Follow Up - 06/01/21 0912       BMI Metric Follow Up-Please document annually   BMI Metric Follow Up Education provided             Preventative Healthcare: Check labs. UTD on vaccines.   Patient Counseling(The following topics were reviewed and/or handout was given):  -Nutrition: Stressed importance of moderation in sodium/caffeine intake, saturated fat and cholesterol, caloric balance, sufficient intake of fresh fruits, vegetables, and fiber.  -Stressed the importance of regular exercise.   -Substance Abuse: Discussed cessation/primary prevention of tobacco, alcohol, or other drug use; driving or other dangerous activities under the influence; availability of treatment for abuse.   -Injury prevention: Discussed safety belts, safety helmets, smoke detector, smoking near bedding or upholstery.   -Sexuality: Discussed sexually transmitted diseases, partner selection, use of condoms, avoidance of unintended pregnancy and contraceptive alternatives.   -Dental health: Discussed importance of regular tooth brushing, flossing, and dental visits.  -Health maintenance and immunizations reviewed. Please refer to Health maintenance section.  Return to care in 1 year for next preventative visit.     Subjective:  HPI:  He has no acute complaints today.   Lifestyle Diet: Cutting down on carbs.  Exercise: Exercising 5 days per week. An hour at a time.   Depression screen PHQ 2/9 06/01/2021  Decreased Interest 0  Down, Depressed, Hopeless 0  PHQ - 2 Score 0    Health Maintenance Due  Topic Date Due    Hepatitis C Screening  Never done   TETANUS/TDAP  Never done   COVID-19 Vaccine (3 - Booster for Janssen series) 06/24/2020    ROS: Per HPI, otherwise a complete review of systems was negative.   PMH:  The following were reviewed and entered/updated in epic: Past Medical History:  Diagnosis Date   HLD (hyperlipidemia)    Low vitamin D level    Patient Active Problem List   Diagnosis Date Noted   Vitamin B12 deficiency 05/28/2020   Insomnia 12/11/2019   Low vitamin D level    HLD (hyperlipidemia)    History reviewed. No pertinent surgical history.  History reviewed. No pertinent family history.  Medications- reviewed and updated No current outpatient medications on file.   No current facility-administered medications for this visit.    Allergies-reviewed and updated No Known Allergies  Social History   Socioeconomic History   Marital status: Married    Spouse name: Not on file   Number of children: Not on file   Years of education: Not on file   Highest education level: Not on file  Occupational History   Not on file  Tobacco Use   Smoking status: Never   Smokeless tobacco: Never  Substance and Sexual Activity   Alcohol use: Yes    Comment: occasionally   Drug use: No   Sexual activity: Yes  Other Topics Concern   Not on file  Social History Narrative   Not on file   Social Determinants of Health   Financial Resource Strain: Not on file  Food Insecurity: Not on file  Transportation Needs: Not on file  Physical Activity:  Not on file  Stress: Not on file  Social Connections: Not on file        Objective:  Physical Exam: BP 107/74   Pulse 77   Temp 98.1 F (36.7 C) (Temporal)   Ht 6\' 3"  (1.905 m)   Wt 220 lb 12.8 oz (100.2 kg)   SpO2 98%   BMI 27.60 kg/m   Body mass index is 27.6 kg/m. Wt Readings from Last 3 Encounters:  06/01/21 220 lb 12.8 oz (100.2 kg)  05/28/20 231 lb (104.8 kg)  02/11/20 235 lb (106.6 kg)   Gen: NAD, resting  comfortably HEENT: TMs normal bilaterally. OP clear. No thyromegaly noted.  CV: RRR with no murmurs appreciated Pulm: NWOB, CTAB with no crackles, wheezes, or rhonchi GI: Normal bowel sounds present. Soft, Nontender, Nondistended. MSK: no edema, cyanosis, or clubbing noted Skin: warm, dry Neuro: CN2-12 grossly intact. Strength 5/5 in upper and lower extremities. Reflexes symmetric and intact bilaterally.  Psych: Normal affect and thought content     Luann Aspinwall M. 02/13/20, MD 06/01/2021 9:18 AM

## 2021-06-02 ENCOUNTER — Other Ambulatory Visit: Payer: Self-pay | Admitting: *Deleted

## 2021-06-02 MED ORDER — VITAMIN D (ERGOCALCIFEROL) 1.25 MG (50000 UNIT) PO CAPS: 50000.0000 [IU] | ORAL_CAPSULE | ORAL | 0 refills | Status: DC

## 2021-06-02 NOTE — Progress Notes (Signed)
Please inform patient of the following:  Vitamin D is very low. Recommend starting 50000IU once weekly. Please send in rx. We can recheck in a 3-6 months.  His B12 is also low. Recommend he take daily. We can also recheck this in 3-6 months.   His blood sugar and cholesterol are borderline elevated. Everything else is normal. Do not need to start any other medications. He should continue working on diet and exercise and we can recheck in a year or so.  Katina Degree. Jimmey Ralph, MD 06/02/2021 8:14 AM

## 2021-07-12 ENCOUNTER — Encounter: Payer: Self-pay | Admitting: Family Medicine

## 2021-07-15 ENCOUNTER — Telehealth: Payer: Self-pay | Admitting: Family Medicine

## 2021-07-15 NOTE — Telephone Encounter (Signed)
Pt states he needs to get some vaccinations, including MMR.

## 2021-07-15 NOTE — Telephone Encounter (Signed)
Is ok to schedule vaccine or titers?

## 2021-07-16 NOTE — Telephone Encounter (Signed)
Can we get more info? Would recommend starting with titers for to see if he needs any vaccines.  Katina Degree. Jimmey Ralph, MD 07/16/2021 7:55 AM

## 2021-08-19 ENCOUNTER — Encounter: Payer: Self-pay | Admitting: Family Medicine

## 2021-08-25 DIAGNOSIS — Z23 Encounter for immunization: Secondary | ICD-10-CM | POA: Diagnosis not present

## 2021-08-28 ENCOUNTER — Other Ambulatory Visit: Payer: Self-pay | Admitting: Family Medicine

## 2021-10-13 ENCOUNTER — Encounter: Payer: Self-pay | Admitting: Family Medicine

## 2021-10-14 ENCOUNTER — Other Ambulatory Visit: Payer: Self-pay | Admitting: *Deleted

## 2021-10-14 DIAGNOSIS — R7989 Other specified abnormal findings of blood chemistry: Secondary | ICD-10-CM

## 2021-10-14 DIAGNOSIS — E782 Mixed hyperlipidemia: Secondary | ICD-10-CM

## 2021-10-14 DIAGNOSIS — Z1329 Encounter for screening for other suspected endocrine disorder: Secondary | ICD-10-CM

## 2021-10-14 DIAGNOSIS — E538 Deficiency of other specified B group vitamins: Secondary | ICD-10-CM

## 2021-10-14 DIAGNOSIS — R739 Hyperglycemia, unspecified: Secondary | ICD-10-CM

## 2021-10-14 DIAGNOSIS — Z13220 Encounter for screening for lipoid disorders: Secondary | ICD-10-CM

## 2021-10-14 NOTE — Telephone Encounter (Signed)
Ok to place future labs. ? ?Brent Pineda. Brent Ralph, MD ?10/14/2021 8:39 AM  ? ?

## 2021-10-20 ENCOUNTER — Other Ambulatory Visit (INDEPENDENT_AMBULATORY_CARE_PROVIDER_SITE_OTHER): Payer: BC Managed Care – PPO

## 2021-10-20 DIAGNOSIS — E538 Deficiency of other specified B group vitamins: Secondary | ICD-10-CM | POA: Diagnosis not present

## 2021-10-20 DIAGNOSIS — Z13220 Encounter for screening for lipoid disorders: Secondary | ICD-10-CM

## 2021-10-20 DIAGNOSIS — R739 Hyperglycemia, unspecified: Secondary | ICD-10-CM | POA: Diagnosis not present

## 2021-10-20 DIAGNOSIS — E782 Mixed hyperlipidemia: Secondary | ICD-10-CM | POA: Diagnosis not present

## 2021-10-20 DIAGNOSIS — R7989 Other specified abnormal findings of blood chemistry: Secondary | ICD-10-CM

## 2021-10-20 DIAGNOSIS — Z1329 Encounter for screening for other suspected endocrine disorder: Secondary | ICD-10-CM

## 2021-10-20 LAB — CBC WITH DIFFERENTIAL/PLATELET
Basophils Absolute: 0 10*3/uL (ref 0.0–0.1)
Basophils Relative: 0.4 % (ref 0.0–3.0)
Eosinophils Absolute: 0 10*3/uL (ref 0.0–0.7)
Eosinophils Relative: 0.9 % (ref 0.0–5.0)
HCT: 42.6 % (ref 39.0–52.0)
Hemoglobin: 14.6 g/dL (ref 13.0–17.0)
Lymphocytes Relative: 32.6 % (ref 12.0–46.0)
Lymphs Abs: 1.9 10*3/uL (ref 0.7–4.0)
MCHC: 34.3 g/dL (ref 30.0–36.0)
MCV: 86.5 fl (ref 78.0–100.0)
Monocytes Absolute: 0.4 10*3/uL (ref 0.1–1.0)
Monocytes Relative: 6.6 % (ref 3.0–12.0)
Neutro Abs: 3.4 10*3/uL (ref 1.4–7.7)
Neutrophils Relative %: 59.5 % (ref 43.0–77.0)
Platelets: 224 10*3/uL (ref 150.0–400.0)
RBC: 4.93 Mil/uL (ref 4.22–5.81)
RDW: 13.8 % (ref 11.5–15.5)
WBC: 5.8 10*3/uL (ref 4.0–10.5)

## 2021-10-20 LAB — COMPREHENSIVE METABOLIC PANEL
ALT: 18 U/L (ref 0–53)
AST: 17 U/L (ref 0–37)
Albumin: 4.5 g/dL (ref 3.5–5.2)
Alkaline Phosphatase: 74 U/L (ref 39–117)
BUN: 13 mg/dL (ref 6–23)
CO2: 27 mEq/L (ref 19–32)
Calcium: 10.1 mg/dL (ref 8.4–10.5)
Chloride: 103 mEq/L (ref 96–112)
Creatinine, Ser: 0.76 mg/dL (ref 0.40–1.50)
GFR: 113.08 mL/min (ref >60.00)
Glucose, Bld: 90 mg/dL (ref 70–99)
Potassium: 4.1 mEq/L (ref 3.5–5.1)
Sodium: 138 mEq/L (ref 135–145)
Total Bilirubin: 0.8 mg/dL (ref 0.2–1.2)
Total Protein: 7.6 g/dL (ref 6.0–8.3)

## 2021-10-20 LAB — TSH: TSH: 2.29 u[IU]/mL (ref 0.35–5.50)

## 2021-10-20 LAB — VITAMIN D 25 HYDROXY (VIT D DEFICIENCY, FRACTURES): VITD: 26.92 ng/mL — ABNORMAL LOW (ref 30.00–100.00)

## 2021-10-20 LAB — HEMOGLOBIN A1C: Hgb A1c MFr Bld: 5.7 % (ref 4.6–6.5)

## 2021-10-20 LAB — LDL CHOLESTEROL, DIRECT: Direct LDL: 184 mg/dL

## 2021-10-20 LAB — LIPID PANEL
Cholesterol: 256 mg/dL — ABNORMAL HIGH (ref 0–200)
HDL: 40.4 mg/dL (ref >39.00)
NonHDL: 215.54
Total CHOL/HDL Ratio: 6
Triglycerides: 227 mg/dL — ABNORMAL HIGH (ref 0.0–149.0)
VLDL: 45.4 mg/dL — ABNORMAL HIGH (ref 0.0–40.0)

## 2021-10-20 LAB — VITAMIN B12: Vitamin B-12: 446 pg/mL (ref 211–911)

## 2021-10-21 ENCOUNTER — Encounter: Payer: Self-pay | Admitting: Family Medicine

## 2021-10-22 ENCOUNTER — Other Ambulatory Visit: Payer: Self-pay

## 2021-10-22 MED ORDER — ATORVASTATIN CALCIUM 20 MG PO TABS
20.0000 mg | ORAL_TABLET | Freq: Every day | ORAL | 3 refills | Status: DC
Start: 2021-10-22 — End: 2022-06-02

## 2021-10-22 NOTE — Telephone Encounter (Signed)
Please see result note. ? ?Brent Pineda. Jimmey Ralph, MD ?10/22/2021 12:42 PM  ? ?

## 2021-10-22 NOTE — Telephone Encounter (Signed)
See note

## 2021-10-22 NOTE — Progress Notes (Signed)
Please inform patient of the following: ? ?Vitamin D is better but still low.  Recommend he take 1000 IUs daily and we can recheck in 3 to 6 months. ? ?His cholesterol levels are still elevated.  It would be reasonable to start atorvastatin 20 mg daily to lower numbers and risk of heart attack and stroke in the future.  Please send in if he is willing to start.  We can recheck again in 6 to 12  months. ? ?His A1c is better.  We can recheck this with next blood draw. ?

## 2021-11-15 ENCOUNTER — Other Ambulatory Visit: Payer: Self-pay | Admitting: Family Medicine

## 2021-11-18 ENCOUNTER — Other Ambulatory Visit: Payer: Self-pay | Admitting: Family Medicine

## 2022-03-15 ENCOUNTER — Encounter: Payer: Self-pay | Admitting: *Deleted

## 2022-06-02 ENCOUNTER — Ambulatory Visit (INDEPENDENT_AMBULATORY_CARE_PROVIDER_SITE_OTHER): Payer: BC Managed Care – PPO | Admitting: Family Medicine

## 2022-06-02 ENCOUNTER — Encounter: Payer: Self-pay | Admitting: Family Medicine

## 2022-06-02 VITALS — BP 108/70 | HR 66 | Temp 97.7°F | Ht 75.0 in | Wt 215.2 lb

## 2022-06-02 DIAGNOSIS — Z0001 Encounter for general adult medical examination with abnormal findings: Secondary | ICD-10-CM | POA: Diagnosis not present

## 2022-06-02 DIAGNOSIS — E538 Deficiency of other specified B group vitamins: Secondary | ICD-10-CM | POA: Diagnosis not present

## 2022-06-02 DIAGNOSIS — Z1159 Encounter for screening for other viral diseases: Secondary | ICD-10-CM

## 2022-06-02 DIAGNOSIS — R7989 Other specified abnormal findings of blood chemistry: Secondary | ICD-10-CM

## 2022-06-02 DIAGNOSIS — E782 Mixed hyperlipidemia: Secondary | ICD-10-CM | POA: Diagnosis not present

## 2022-06-02 DIAGNOSIS — Z8249 Family history of ischemic heart disease and other diseases of the circulatory system: Secondary | ICD-10-CM

## 2022-06-02 DIAGNOSIS — R739 Hyperglycemia, unspecified: Secondary | ICD-10-CM | POA: Diagnosis not present

## 2022-06-02 LAB — VITAMIN D 25 HYDROXY (VIT D DEFICIENCY, FRACTURES): VITD: 15.8 ng/mL — ABNORMAL LOW (ref 30.00–100.00)

## 2022-06-02 LAB — CBC
HCT: 44.8 % (ref 39.0–52.0)
Hemoglobin: 15.1 g/dL (ref 13.0–17.0)
MCHC: 33.6 g/dL (ref 30.0–36.0)
MCV: 87.5 fl (ref 78.0–100.0)
Platelets: 245 10*3/uL (ref 150.0–400.0)
RBC: 5.12 Mil/uL (ref 4.22–5.81)
RDW: 13.1 % (ref 11.5–15.5)
WBC: 6.5 10*3/uL (ref 4.0–10.5)

## 2022-06-02 LAB — COMPREHENSIVE METABOLIC PANEL
ALT: 18 U/L (ref 0–53)
AST: 19 U/L (ref 0–37)
Albumin: 4.8 g/dL (ref 3.5–5.2)
Alkaline Phosphatase: 67 U/L (ref 39–117)
BUN: 14 mg/dL (ref 6–23)
CO2: 28 mEq/L (ref 19–32)
Calcium: 10.3 mg/dL (ref 8.4–10.5)
Chloride: 103 mEq/L (ref 96–112)
Creatinine, Ser: 0.69 mg/dL (ref 0.40–1.50)
GFR: 115.93 mL/min (ref >60.00)
Glucose, Bld: 96 mg/dL (ref 70–99)
Potassium: 4.3 mEq/L (ref 3.5–5.1)
Sodium: 139 mEq/L (ref 135–145)
Total Bilirubin: 0.8 mg/dL (ref 0.2–1.2)
Total Protein: 7.7 g/dL (ref 6.0–8.3)

## 2022-06-02 LAB — VITAMIN B12: Vitamin B-12: 259 pg/mL (ref 211–911)

## 2022-06-02 LAB — LIPID PANEL
Cholesterol: 237 mg/dL — ABNORMAL HIGH (ref 0–200)
HDL: 42.5 mg/dL (ref >39.00)
NonHDL: 194.75
Total CHOL/HDL Ratio: 6
Triglycerides: 226 mg/dL — ABNORMAL HIGH (ref 0.0–149.0)
VLDL: 45.2 mg/dL — ABNORMAL HIGH (ref 0.0–40.0)

## 2022-06-02 LAB — LDL CHOLESTEROL, DIRECT: Direct LDL: 168 mg/dL

## 2022-06-02 LAB — TSH: TSH: 1.58 u[IU]/mL (ref 0.35–5.50)

## 2022-06-02 LAB — HEMOGLOBIN A1C: Hgb A1c MFr Bld: 6 % (ref 4.6–6.5)

## 2022-06-02 NOTE — Assessment & Plan Note (Addendum)
Stopped statin few months after starting.  Been working on diet and exercise.  Recheck lipids today.  Will check cardiac CT scan to screen for coronary artery disease.

## 2022-06-02 NOTE — Assessment & Plan Note (Signed)
Check B12 

## 2022-06-02 NOTE — Assessment & Plan Note (Signed)
Will check cardiac calcium score.

## 2022-06-02 NOTE — Patient Instructions (Signed)
It was very nice to see you today!  We will check blood work today.  Will also get a cardiac CT scan.  Please keep up the good work with your diet and exercise.  I will see you back in year for your next physical.  Please come back to see me sooner if needed.  Take care, Dr Jimmey Ralph  PLEASE NOTE:  If you had any lab tests please let us know if you have not heard back within a few days. You may see your results on mychart before we have a chance to review them but we will give you a call once they are reviewed by [place]. If we ordered any referrals today, please let us know if you have not heard from their office within the next week.   Please try these tips to maintain a healthy lifestyle:  Eat at least 3 REAL meals and 1-2 snacks per day.  Aim for no more than 5 hours between eating.  If you eat breakfast, please do so within one hour of getting up.   Each meal should contain half fruits/vegetables, one quarter protein, and one quarter carbs (no bigger than a computer mouse)  Cut down on sweet beverages. This includes juice, soda, and sweet tea.   Drink at least 1 glass of water with each meal and aim for at least 8 glasses per day  Exercise at least 150 minutes every week.    Preventive Care 5-15 Years Old, Male Preventive care refers to lifestyle choices and visits with your health care provider that can promote health and wellness. Preventive care visits are also called wellness exams. What can I expect for my preventive care visit? Counseling During your preventive care visit, your health care provider may ask about your: Medical history, including: Past medical problems. Family medical history. Current health, including: Emotional well-being. Home life and relationship well-being. Sexual activity. Lifestyle, including: Alcohol, nicotine or tobacco, and drug use. Access to firearms. Diet, exercise, and sleep habits. Safety issues such as seatbelt and bike helmet  use. Sunscreen use. Work and work Astronomer. Physical exam Your health care provider will check your: Height and weight. These may be used to calculate your BMI (body mass index). BMI is a measurement that tells if you are at a healthy weight. Waist circumference. This measures the distance around your waistline. This measurement also tells if you are at a healthy weight and may help predict your risk of certain diseases, such as type 2 diabetes and high blood pressure. Heart rate and blood pressure. Body temperature. Skin for abnormal spots. What immunizations do I need?  Vaccines are usually given at various ages, according to a schedule. Your health care provider will recommend vaccines for you based on your age, medical history, and lifestyle or other factors, such as travel or where you work. What tests do I need? Screening Your health care provider may recommend screening tests for certain conditions. This may include: Lipid and cholesterol levels. Diabetes screening. This is done by checking your blood sugar (glucose) after you have not eaten for a while (fasting). Hepatitis B test. Hepatitis C test. HIV (human immunodeficiency virus) test. STI (sexually transmitted infection) testing, if you are at risk. Lung cancer screening. Prostate cancer screening. Colorectal cancer screening. Talk with your health care provider about your test results, treatment options, and if necessary, the need for more tests. Follow these instructions at home: Eating and drinking  Eat a diet that includes fresh fruits and vegetables,  whole grains, lean protein, and low-fat dairy products. Take vitamin and mineral supplements as recommended by your health care provider. Do not drink alcohol if your health care provider tells you not to drink. If you drink alcohol: Limit how much you have to 0-2 drinks a day. Know how much alcohol is in your drink. In the [place] one drink equals one 12 oz bottle of  beer (355 mL), one 5 oz glass of wine (148 mL), or one 1 oz glass of hard liquor (44 mL). Lifestyle Brush your teeth every morning and night with fluoride toothpaste. Floss one time each day. Exercise for at least 30 minutes 5 or more days each week. Do not use any products that contain nicotine or tobacco. These products include cigarettes, chewing tobacco, and vaping devices, such as e-cigarettes. If you need help quitting, ask your health care provider. Do not use drugs. If you are sexually active, practice safe sex. Use a condom or other form of protection to prevent STIs. Take aspirin only as told by your health care provider. Make sure that you understand how much to take and what form to take. Work with your health care provider to find out whether it is safe and beneficial for you to take aspirin daily. Find healthy ways to manage stress, such as: Meditation, yoga, or listening to music. Journaling. Talking to a trusted person. Spending time with friends and family. Minimize exposure to UV radiation to reduce your risk of skin cancer. Safety Always wear your seat belt while driving or riding in a vehicle. Do not drive: If you have been drinking alcohol. Do not ride with someone who has been drinking. When you are tired or distracted. While texting. If you have been using any mind-altering substances or drugs. Wear a helmet and other protective equipment during sports activities. If you have firearms in your house, make sure you follow all gun safety procedures. What's next? Go to your health care provider once a year for an annual wellness visit. Ask your health care provider how often you should have your eyes and teeth checked. Stay up to date on all vaccines. This information is not intended to replace advice given to you by your health care provider. Make sure you discuss any questions you have with your health care provider. Document Revised: 12/03/2020 Document Reviewed:  12/03/2020 Elsevier Patient Education  [place].

## 2022-06-02 NOTE — Progress Notes (Signed)
Chief Complaint:  Brent Pineda is a 41 y.o. male who presents today for his annual comprehensive physical exam.    Assessment/Plan:  Chronic Problems Addressed Today: HLD (hyperlipidemia) Stopped statin few months after starting.  Been working on diet and exercise.  Recheck lipids today.  Will check cardiac CT scan to screen for coronary artery disease.  Vitamin B12 deficiency Check B12.  Low vitamin D level Check vitamin D.  Not currently on any supplements.  Family history of heart disease Will check cardiac calcium score.  Preventative Healthcare: Check labs.  Up-to-date on vaccines.  Patient Counseling(The following topics were reviewed and/or handout was given):  -Nutrition: Stressed importance of moderation in sodium/caffeine intake, saturated fat and cholesterol, caloric balance, sufficient intake of fresh fruits, vegetables, and fiber.  -Stressed the importance of regular exercise.   -Substance Abuse: Discussed cessation/primary prevention of tobacco, alcohol, or other drug use; driving or other dangerous activities under the influence; availability of treatment for abuse.   -Injury prevention: Discussed safety belts, safety helmets, smoke detector, smoking near bedding or upholstery.   -Sexuality: Discussed sexually transmitted diseases, partner selection, use of condoms, avoidance of unintended pregnancy and contraceptive alternatives.   -Dental health: Discussed importance of regular tooth brushing, flossing, and dental visits.  -Health maintenance and immunizations reviewed. Please refer to Health maintenance section.  Return to care in 1 year for next preventative visit.     Subjective:  HPI:  He has no acute complaints today.   Since our last visit he was found to have elevated LDL at 184. He was started on lipitor.  He did this for couple of months but then stopped.  Is been working on cutting down sugar and fat from his diet.  Lifestyle Diet: Cutting  down on sugar and fat.  Exercise: Walk daily.      06/02/2022    8:15 AM  Depression screen PHQ 2/9  Decreased Interest 0  Down, Depressed, Hopeless 0  PHQ - 2 Score 0    Health Maintenance Due  Topic Date Due   Hepatitis C Screening  Never done     ROS: Per HPI, otherwise a complete review of systems was negative.   PMH:  The following were reviewed and entered/updated in epic: Past Medical History:  Diagnosis Date   HLD (hyperlipidemia)    Low vitamin D level    Patient Active Problem List   Diagnosis Date Noted   Family history of heart disease 06/02/2022   Vitamin B12 deficiency 05/28/2020   Insomnia 12/11/2019   Low vitamin D level    HLD (hyperlipidemia)    History reviewed. No pertinent surgical history.  History reviewed. No pertinent family history.  Medications- reviewed and updated No current outpatient medications on file.   No current facility-administered medications for this visit.    Allergies-reviewed and updated No Known Allergies  Social History   Socioeconomic History   Marital status: Married    Spouse name: Not on file   Number of children: Not on file   Years of education: Not on file   Highest education level: Not on file  Occupational History   Not on file  Tobacco Use   Smoking status: Never   Smokeless tobacco: Never  Substance and Sexual Activity   Alcohol use: Yes    Comment: occasionally   Drug use: No   Sexual activity: Yes  Other Topics Concern   Not on file  Social History Narrative   Not on file  Social Determinants of Health   Financial Resource Strain: Not on file  Food Insecurity: Not on file  Transportation Needs: Not on file  Physical Activity: Not on file  Stress: Not on file  Social Connections: Not on file        Objective:  Physical Exam: BP 108/70   Pulse 66   Temp 97.7 F (36.5 C) (Temporal)   Ht 6\' 3"  (1.905 m)   Wt 215 lb 3.2 oz (97.6 kg)   SpO2 100%   BMI 26.90 kg/m   Body  mass index is 26.9 kg/m. Wt Readings from Last 3 Encounters:  06/02/22 215 lb 3.2 oz (97.6 kg)  06/01/21 220 lb 12.8 oz (100.2 kg)  05/28/20 231 lb (104.8 kg)   Gen: NAD, resting comfortably HEENT: TMs normal bilaterally. OP clear. No thyromegaly noted.  CV: RRR with no murmurs appreciated Pulm: NWOB, CTAB with no crackles, wheezes, or rhonchi GI: Normal bowel sounds present. Soft, Nontender, Nondistended. MSK: no edema, cyanosis, or clubbing noted Skin: warm, dry Neuro: CN2-12 grossly intact. Strength 5/5 in upper and lower extremities. Reflexes symmetric and intact bilaterally.  Psych: Normal affect and thought content     Alicya Bena M. 14/08/21, MD 06/02/2022 9:04 AM

## 2022-06-02 NOTE — Assessment & Plan Note (Signed)
Check vitamin D.  Not currently on any supplements. 

## 2022-06-03 ENCOUNTER — Encounter: Payer: Self-pay | Admitting: Family Medicine

## 2022-06-03 LAB — HEPATITIS C ANTIBODY: Hepatitis C Ab: NONREACTIVE

## 2022-06-04 NOTE — Progress Notes (Signed)
Please inform patient of the following:  His lipids are a little bit better than last time that we checked.  It is okay for [place] to hold off on starting any other medications at this point until we get his cardiac CT scan.  His blood sugar is borderline elevated.  Do not need to start meds for this but he should continue to work on diet and exercise and we can recheck in year.  His B12 is in the low range of normal.  Would be reasonable for him to start supplementation with 1000 mcg daily.  His vitamin D is low.  Recommend he start 5000 IUs once daily.  We can recheck this in 3 to 6 months.  We can recheck everything else in a year.

## 2022-06-04 NOTE — Telephone Encounter (Signed)
See results note.  Brent Pineda. Jimmey Ralph, MD 06/04/2022 1:03 PM

## 2022-06-25 ENCOUNTER — Ambulatory Visit (HOSPITAL_COMMUNITY)
Admission: RE | Admit: 2022-06-25 | Discharge: 2022-06-25 | Disposition: A | Discharge status: Home / Self Care | Payer: BC Managed Care – PPO | Source: Ambulatory Visit | Attending: Family Medicine | Admitting: Family Medicine

## 2022-06-25 DIAGNOSIS — E782 Mixed hyperlipidemia: Secondary | ICD-10-CM | POA: Insufficient documentation

## 2022-06-29 NOTE — Progress Notes (Signed)
Please inform patient of the following:  Great news! His calcium score is 0.  This means he has no signs of blockages in the arteries in his heart.  Do not need to make any changes to his treatment plan at this time.  He should continue to work on diet and exercise and we can repeat the scan in 5 to 10 years.

## 2022-11-23 ENCOUNTER — Ambulatory Visit: Payer: Self-pay | Admitting: Family Medicine

## 2022-11-30 ENCOUNTER — Ambulatory Visit (INDEPENDENT_AMBULATORY_CARE_PROVIDER_SITE_OTHER): Payer: Managed Care, Other (non HMO) | Admitting: Family Medicine

## 2022-11-30 ENCOUNTER — Encounter: Payer: Self-pay | Admitting: Family Medicine

## 2022-11-30 VITALS — BP 112/74 | HR 69 | Temp 97.8°F | Ht 75.0 in | Wt 217.4 lb

## 2022-11-30 DIAGNOSIS — R7989 Other specified abnormal findings of blood chemistry: Secondary | ICD-10-CM | POA: Diagnosis not present

## 2022-11-30 DIAGNOSIS — E538 Deficiency of other specified B group vitamins: Secondary | ICD-10-CM | POA: Diagnosis not present

## 2022-11-30 DIAGNOSIS — E782 Mixed hyperlipidemia: Secondary | ICD-10-CM

## 2022-11-30 DIAGNOSIS — Z131 Encounter for screening for diabetes mellitus: Secondary | ICD-10-CM

## 2022-11-30 LAB — CBC
HCT: 44.9 % (ref 39.0–52.0)
Hemoglobin: 14.5 g/dL (ref 13.0–17.0)
MCHC: 32.3 g/dL (ref 30.0–36.0)
MCV: 88.2 fl (ref 78.0–100.0)
Platelets: 226 10*3/uL (ref 150.0–400.0)
RBC: 5.09 Mil/uL (ref 4.22–5.81)
RDW: 13.6 % (ref 11.5–15.5)
WBC: 6.1 10*3/uL (ref 4.0–10.5)

## 2022-11-30 LAB — LIPID PANEL
Cholesterol: 233 mg/dL — ABNORMAL HIGH (ref 0–200)
HDL: 42.7 mg/dL (ref >39.00)
LDL Cholesterol: 152 mg/dL — ABNORMAL HIGH (ref 0–99)
NonHDL: 189.95
Total CHOL/HDL Ratio: 5
Triglycerides: 191 mg/dL — ABNORMAL HIGH (ref 0.0–149.0)
VLDL: 38.2 mg/dL (ref 0.0–40.0)

## 2022-11-30 LAB — COMPREHENSIVE METABOLIC PANEL
ALT: 13 U/L (ref 0–53)
AST: 16 U/L (ref 0–37)
Albumin: 4.4 g/dL (ref 3.5–5.2)
Alkaline Phosphatase: 58 U/L (ref 39–117)
BUN: 12 mg/dL (ref 6–23)
CO2: 29 mEq/L (ref 19–32)
Calcium: 9.7 mg/dL (ref 8.4–10.5)
Chloride: 104 mEq/L (ref 96–112)
Creatinine, Ser: 0.69 mg/dL (ref 0.40–1.50)
GFR: 115.52 mL/min (ref >60.00)
Glucose, Bld: 94 mg/dL (ref 70–99)
Potassium: 4.3 mEq/L (ref 3.5–5.1)
Sodium: 139 mEq/L (ref 135–145)
Total Bilirubin: 0.6 mg/dL (ref 0.2–1.2)
Total Protein: 7.4 g/dL (ref 6.0–8.3)

## 2022-11-30 LAB — VITAMIN D 25 HYDROXY (VIT D DEFICIENCY, FRACTURES): VITD: 16.65 ng/mL — ABNORMAL LOW (ref 30.00–100.00)

## 2022-11-30 LAB — HEMOGLOBIN A1C: Hgb A1c MFr Bld: 5.6 % (ref 4.6–6.5)

## 2022-11-30 LAB — VITAMIN B12: Vitamin B-12: 220 pg/mL (ref 211–911)

## 2022-11-30 NOTE — Patient Instructions (Signed)
It was very nice to see you today!  We will repeat blood work today.  Please continue to work on diet and exercise.  Return in about 6 months (around 06/01/2023) for Annual Physical.   Take care, Dr Jimmey Ralph  PLEASE NOTE:  If you had any lab tests, please let us know if you have not heard back within a few days. You may see your results on mychart before we have a chance to review them but we will give you a call once they are reviewed by [place].   If we ordered any referrals today, please let us know if you have not heard from their office within the next week.   If you had any urgent prescriptions sent in today, please check with the pharmacy within an hour of our visit to make sure the prescription was transmitted appropriately.   Please try these tips to maintain a healthy lifestyle:  Eat at least 3 REAL meals and 1-2 snacks per day.  Aim for no more than 5 hours between eating.  If you eat breakfast, please do so within one hour of getting up.   Each meal should contain half fruits/vegetables, one quarter protein, and one quarter carbs (no bigger than a computer mouse)  Cut down on sweet beverages. This includes juice, soda, and sweet tea.   Drink at least 1 glass of water with each meal and aim for at least 8 glasses per day  Exercise at least 150 minutes every week.

## 2022-11-30 NOTE — Assessment & Plan Note (Signed)
Check B12 

## 2022-11-30 NOTE — Progress Notes (Signed)
   Beuford Garcilazo is a 42 y.o. male who presents today for an office visit.  Assessment/Plan:  Chronic Problems Addressed Today: HLD (hyperlipidemia) - CAC score 0 in 06/2022 Cardiac CT scan 0 few months ago.  We can repeat again in about 5 to 10 years.  He is working on diet and exercise.  Recheck lipids today.  Low vitamin D level Check vitamin D.  Not currently on any supplements.  Vitamin B12 deficiency Check B12.    Subjective:  HPI:  See A/P for status of chronic conditions.  Patient is here today for follow-up.  Last seen about 6 months ago for annual physical.  At that time labs were notable for elevated cholesterol and low vitamin D.  We recommend starting 5000 IUs vitamin D daily.  His cardiac CT scan showed a score of 0.  Since our last visit he has been reducing portion sizes. He has been also walking routinely about 6-7 miles per day.        Objective:  Physical Exam: BP 112/74   Pulse 69   Temp 97.8 F (36.6 C) (Temporal)   Ht 6\' 3"  (1.905 m)   Wt 217 lb 6.4 oz (98.6 kg)   SpO2 99%   BMI 27.17 kg/m   Gen: No acute distress, resting comfortably CV: Regular rate and rhythm with no murmurs appreciated Pulm: Normal work of breathing, clear to auscultation bilaterally with no crackles, wheezes, or rhonchi Neuro: Grossly normal, moves all extremities Psych: Normal affect and thought content      Kemaya Dorner M. Jimmey Ralph, MD 11/30/2022 9:38 AM

## 2022-11-30 NOTE — Assessment & Plan Note (Signed)
Check vitamin D.  Not currently on any supplements. 

## 2022-11-30 NOTE — Assessment & Plan Note (Signed)
Cardiac CT scan 0 few months ago.  We can repeat again in about 5 to 10 years.  He is working on diet and exercise.  Recheck lipids today.

## 2022-12-03 ENCOUNTER — Encounter: Payer: Self-pay | Admitting: Family Medicine

## 2022-12-03 NOTE — Progress Notes (Signed)
Vitamin D is still low.  Recommend starting 5000 IUs once daily.  We should recheck again in 3 to 6 months.  B12 is on the lower side of normal.  Recommend starting 1000 mcg daily and we can recheck this again in 3 to 6 months.  Cholesterol levels are about the same as last time.  Do not need to start meds but he should continue to work on diet and exercise and we can recheck again in 3 to 6 months.  His A1c is much better than last time.  We can also recheck this again in 3 to 6 months.

## 2022-12-06 NOTE — Telephone Encounter (Signed)
See results note. 

## 2022-12-29 ENCOUNTER — Ambulatory Visit (INDEPENDENT_AMBULATORY_CARE_PROVIDER_SITE_OTHER): Payer: Managed Care, Other (non HMO) | Admitting: Sports Medicine

## 2022-12-29 VITALS — BP 106/72 | HR 87 | Ht 75.0 in | Wt 217.0 lb

## 2022-12-29 DIAGNOSIS — M545 Low back pain, unspecified: Secondary | ICD-10-CM | POA: Diagnosis not present

## 2022-12-29 DIAGNOSIS — M9903 Segmental and somatic dysfunction of lumbar region: Secondary | ICD-10-CM | POA: Diagnosis not present

## 2022-12-29 DIAGNOSIS — M9908 Segmental and somatic dysfunction of rib cage: Secondary | ICD-10-CM | POA: Diagnosis not present

## 2022-12-29 DIAGNOSIS — M9902 Segmental and somatic dysfunction of thoracic region: Secondary | ICD-10-CM

## 2022-12-29 MED ORDER — TIZANIDINE HCL 4 MG PO TABS: 4.0000 mg | ORAL_TABLET | Freq: Two times a day (BID) | ORAL | 0 refills | Status: DC | PRN

## 2022-12-29 MED ORDER — MELOXICAM 15 MG PO TABS
15.0000 mg | ORAL_TABLET | Freq: Every day | ORAL | 0 refills | Status: DC
Start: 2022-12-29 — End: 2023-06-06

## 2022-12-29 NOTE — Progress Notes (Signed)
Brent Pineda D[facility] [address] [place] [address] Phone: [phone]   Assessment and Plan:     1. Acute left-sided low back pain without sciatica 2. Somatic dysfunction of thoracic region 3. Somatic dysfunction of lumbar region 4. Somatic dysfunction of rib region -Acute, uncomplicated, initial sports medicine visit - Most consistent with strain of left-sided lumbar musculature during camping activities - No red flag symptoms, so no imaging at today's visit - Start meloxicam 15 mg daily x2 weeks.  If still having pain after 2 weeks, complete 3rd-week of meloxicam. May use remaining meloxicam as needed once daily for pain control.  Do not to use additional NSAIDs while taking meloxicam.  May use Tylenol [phone] mg 2 to 3 times a day for breakthrough pain. - Start tizanidine 4 to 8 mg twice a day as needed for muscle spasms - Start HEP for low back - Patient elected for a initial OMT today.  Tolerated well per note below. - Decision today to treat with OMT was based on Physical Exam  After verbal consent patient was treated with HVLA (high velocity low amplitude), ME (muscle energy), FPR (flex positional release), ST (soft tissue), techniques in cervical, rib, thoracic, lumbar, and pelvic areas. Patient tolerated the procedure well with improvement in symptoms.  Patient educated on potential side effects of soreness and recommended to rest, hydrate, and use Tylenol as needed for pain control.  Other orders - meloxicam (MOBIC) 15 MG tablet; Take 1 tablet (15 mg total) by mouth daily. - tiZANidine (ZANAFLEX) 4 MG tablet; Take 1 tablet (4 mg total) by mouth 2 (two) times daily as needed for muscle spasms.    Pertinent previous records reviewed include none   Follow Up: 4 weeks for reevaluation.  Could consider repeat OMT if medications decrease pain.  Could consider physical therapy versus lumbar x-ray   Subjective:   I, Brent  Pineda, am serving as a Neurosurgeon for Doctor Richardean Sale  Chief Complaint: back pain   HPI:   12/29/22 Patient is a 42 year old male complaining of back pain. Patient states that he went camping this weekend, he now has left sided back pain. He was sleeping in a tent on a mat. Pain radiates up to thoracic . He has a trip to [place] tomorrow. [place] for the pain and that didn't help much. No numbness and tingling but constant pain. Pain with sitting no pain when walking.    Relevant Historical Information: None pertinent  Additional pertinent review of systems negative.   Current Outpatient Medications:    meloxicam (MOBIC) 15 MG tablet, Take 1 tablet (15 mg total) by mouth daily., Disp: 30 tablet, Rfl: 0   tiZANidine (ZANAFLEX) 4 MG tablet, Take 1 tablet (4 mg total) by mouth 2 (two) times daily as needed for muscle spasms., Disp: 40 tablet, Rfl: 0   Objective:     Vitals:   12/29/22 1450  BP: 106/72  Pulse: 87  SpO2: 98%  Weight: 217 lb (98.4 kg)  Height: 6\' 3"  (1.905 m)      Body mass index is 27.12 kg/m.    Physical Exam:    Gen: Appears well, nad, nontoxic and pleasant Psych: Alert and oriented, appropriate mood and affect Neuro: sensation intact, strength is 5/5 in upper and lower extremities, muscle tone wnl Skin: no susupicious lesions or rashes  Back - Normal skin, Spine with normal alignment and no deformity.   No tenderness to vertebral process  palpation.   Paraspinous muscles are not tender and without spasm NTTP gluteal musculature Straight leg raise negative for radicular pain, though bilaterally reproduced left-sided low back pain Trendelenberg negative    OMT Physical Exam:    Rib: Ribs 6-8 stuck in inhalation on right Thoracic: TTP paraspinal, T6-8 RRSL Lumbar: TTP paraspinal, L1-3 RLSR      Electronically signed by:  Brent Pineda [facility] 3:32 PM 12/29/22

## 2022-12-29 NOTE — Patient Instructions (Signed)
-   Start meloxicam 15 mg daily x2 weeks.  If still having pain after 2 weeks, complete 3rd-week of meloxicam. May use remaining meloxicam as needed once daily for pain control.  Do not to use additional NSAIDs while taking meloxicam.  May use Tylenol [phone] mg 2 to 3 times a day for breakthrough pain. Tizanidine 4 mg can [place] e 1-2 tablets twice  day as needed for muscle spasms  Low back HEP

## 2023-01-26 ENCOUNTER — Ambulatory Visit: Payer: Managed Care, Other (non HMO) | Admitting: Sports Medicine

## 2023-05-09 ENCOUNTER — Telehealth: Payer: Self-pay | Admitting: Family Medicine

## 2023-05-09 NOTE — Telephone Encounter (Signed)
Patient has CPE on 12/16. Wants to know if can have labs completed prior to this. Please Advise.

## 2023-05-10 ENCOUNTER — Other Ambulatory Visit: Payer: Self-pay | Admitting: *Deleted

## 2023-05-10 DIAGNOSIS — Z1329 Encounter for screening for other suspected endocrine disorder: Secondary | ICD-10-CM

## 2023-05-10 DIAGNOSIS — E538 Deficiency of other specified B group vitamins: Secondary | ICD-10-CM

## 2023-05-10 DIAGNOSIS — Z0000 Encounter for general adult medical examination without abnormal findings: Secondary | ICD-10-CM

## 2023-05-10 DIAGNOSIS — E782 Mixed hyperlipidemia: Secondary | ICD-10-CM

## 2023-05-10 DIAGNOSIS — R739 Hyperglycemia, unspecified: Secondary | ICD-10-CM

## 2023-05-10 DIAGNOSIS — R7989 Other specified abnormal findings of blood chemistry: Secondary | ICD-10-CM

## 2023-05-10 NOTE — Telephone Encounter (Signed)
Ok to order labs? 

## 2023-05-10 NOTE — Telephone Encounter (Signed)
Please schedule a lab visit prior to CPE  Future lab order

## 2023-05-10 NOTE — Telephone Encounter (Signed)
Ok with me. Please place any necessary orders. 

## 2023-05-31 ENCOUNTER — Other Ambulatory Visit (INDEPENDENT_AMBULATORY_CARE_PROVIDER_SITE_OTHER): Payer: Managed Care, Other (non HMO)

## 2023-05-31 DIAGNOSIS — E538 Deficiency of other specified B group vitamins: Secondary | ICD-10-CM

## 2023-05-31 DIAGNOSIS — E559 Vitamin D deficiency, unspecified: Secondary | ICD-10-CM | POA: Diagnosis not present

## 2023-05-31 DIAGNOSIS — R739 Hyperglycemia, unspecified: Secondary | ICD-10-CM

## 2023-05-31 DIAGNOSIS — R7989 Other specified abnormal findings of blood chemistry: Secondary | ICD-10-CM

## 2023-05-31 DIAGNOSIS — Z1329 Encounter for screening for other suspected endocrine disorder: Secondary | ICD-10-CM

## 2023-05-31 DIAGNOSIS — E782 Mixed hyperlipidemia: Secondary | ICD-10-CM

## 2023-05-31 DIAGNOSIS — Z0000 Encounter for general adult medical examination without abnormal findings: Secondary | ICD-10-CM | POA: Diagnosis not present

## 2023-05-31 LAB — COMPREHENSIVE METABOLIC PANEL
ALT: 21 U/L (ref 0–53)
AST: 22 U/L (ref 0–37)
Albumin: 4.4 g/dL (ref 3.5–5.2)
Alkaline Phosphatase: 61 U/L (ref 39–117)
BUN: 17 mg/dL (ref 6–23)
CO2: 27 meq/L (ref 19–32)
Calcium: 9.7 mg/dL (ref 8.4–10.5)
Chloride: 102 meq/L (ref 96–112)
Creatinine, Ser: 0.75 mg/dL (ref 0.40–1.50)
GFR: 112.26 mL/min (ref >60.00)
Glucose, Bld: 97 mg/dL (ref 70–99)
Potassium: 4.1 meq/L (ref 3.5–5.1)
Sodium: 138 meq/L (ref 135–145)
Total Bilirubin: 0.8 mg/dL (ref 0.2–1.2)
Total Protein: 7.5 g/dL (ref 6.0–8.3)

## 2023-05-31 LAB — CBC
HCT: 45.6 % (ref 39.0–52.0)
Hemoglobin: 15.2 g/dL (ref 13.0–17.0)
MCHC: 33.4 g/dL (ref 30.0–36.0)
MCV: 89.5 fL (ref 78.0–100.0)
Platelets: 227 10*3/uL (ref 150.0–400.0)
RBC: 5.1 Mil/uL (ref 4.22–5.81)
RDW: 14 % (ref 11.5–15.5)
WBC: 5.8 10*3/uL (ref 4.0–10.5)

## 2023-05-31 LAB — LIPID PANEL
Cholesterol: 239 mg/dL — ABNORMAL HIGH (ref 0–200)
HDL: 43.5 mg/dL (ref >39.00)
LDL Cholesterol: 153 mg/dL — ABNORMAL HIGH (ref 0–99)
NonHDL: 195.67
Total CHOL/HDL Ratio: 5
Triglycerides: 215 mg/dL — ABNORMAL HIGH (ref 0.0–149.0)
VLDL: 43 mg/dL — ABNORMAL HIGH (ref 0.0–40.0)

## 2023-05-31 LAB — HEMOGLOBIN A1C: Hgb A1c MFr Bld: 5.9 % (ref 4.6–6.5)

## 2023-05-31 LAB — TSH: TSH: 1.85 u[IU]/mL (ref 0.35–5.50)

## 2023-05-31 LAB — VITAMIN D 25 HYDROXY (VIT D DEFICIENCY, FRACTURES): VITD: 11.82 ng/mL — ABNORMAL LOW (ref 30.00–100.00)

## 2023-06-01 LAB — VITAMIN B12: Vitamin B-12: 377 pg/mL (ref 200–1100)

## 2023-06-03 NOTE — Progress Notes (Signed)
Vitamin D very low.  Recommend starting 50,000 IUs once weekly.  We should recheck in 3 to 6 months.  Please send in prescription and place future order.  Cholesterol and blood sugar are borderline but similar to last year.  Do not need to start meds for either of these however he should work on diet and exercise and we can recheck in a year.  The rest of his labs are all normal and we can recheck in a year.  We can discuss this at upcoming office visit.

## 2023-06-06 ENCOUNTER — Encounter: Payer: Self-pay | Admitting: Family Medicine

## 2023-06-06 ENCOUNTER — Ambulatory Visit (INDEPENDENT_AMBULATORY_CARE_PROVIDER_SITE_OTHER): Payer: Managed Care, Other (non HMO) | Admitting: Family Medicine

## 2023-06-06 ENCOUNTER — Other Ambulatory Visit: Payer: Self-pay | Admitting: *Deleted

## 2023-06-06 VITALS — BP 114/74 | HR 76 | Temp 97.8°F | Resp 18 | Ht 75.0 in | Wt 229.2 lb

## 2023-06-06 DIAGNOSIS — Z0001 Encounter for general adult medical examination with abnormal findings: Secondary | ICD-10-CM | POA: Diagnosis not present

## 2023-06-06 DIAGNOSIS — G4700 Insomnia, unspecified: Secondary | ICD-10-CM

## 2023-06-06 DIAGNOSIS — E782 Mixed hyperlipidemia: Secondary | ICD-10-CM

## 2023-06-06 DIAGNOSIS — E538 Deficiency of other specified B group vitamins: Secondary | ICD-10-CM | POA: Diagnosis not present

## 2023-06-06 DIAGNOSIS — K649 Unspecified hemorrhoids: Secondary | ICD-10-CM

## 2023-06-06 DIAGNOSIS — R7989 Other specified abnormal findings of blood chemistry: Secondary | ICD-10-CM | POA: Diagnosis not present

## 2023-06-06 MED ORDER — VITAMIN D (ERGOCALCIFEROL) 1.25 MG (50000 UNIT) PO CAPS: 50000.0000 [IU] | ORAL_CAPSULE | ORAL | 0 refills | Status: DC

## 2023-06-06 NOTE — Assessment & Plan Note (Addendum)
Vitamin D very low.  He will start 50,000 IUs once weekly.  Will send in prescription for this today.  Recheck in 3 to 6 months.

## 2023-06-06 NOTE — Progress Notes (Signed)
Chief Complaint:  Brent Pineda is a 42 y.o. male who presents today for his annual comprehensive physical exam.    Assessment/Plan:  New/Acute Problems: Hemorrhoid  No red flags.  Small resolving hemorrhoid noted on exam today.  We did discussed conservative measures including high-fiber diet.  He can also start a stool softener.  Encouraged good hydration.  He will let us know if symptoms return or fail to improve over the next couple weeks and we can refer to gastroenterology or general surgery as needed.   Chronic Problems Addressed Today: Low vitamin D level Vitamin D very low.  He will start 50,000 IUs once weekly.  Will send in prescription for this today.  Recheck in 3 to 6 months.  HLD (hyperlipidemia) - CAC score 0 in 06/2022 LDL above goal.  He did have a cardiac CT scan several months ago which was normal.  He is working on lifestyle interventions.  Will recheck lipids in 1 year.  Vitamin B12 deficiency B12 on low range of normal.  He will start taking over-the-counter B12 supplement.  Preventative Healthcare: Up-to-date on vaccines.  Due for colon cancer screening at age 13.  Patient Counseling(The following topics were reviewed and/or handout was given):  -Nutrition: Stressed importance of moderation in sodium/caffeine intake, saturated fat and cholesterol, caloric balance, sufficient intake of fresh fruits, vegetables, and fiber.  -Stressed the importance of regular exercise.   -Substance Abuse: Discussed cessation/primary prevention of tobacco, alcohol, or other drug use; driving or other dangerous activities under the influence; availability of treatment for abuse.   -Injury prevention: Discussed safety belts, safety helmets, smoke detector, smoking near bedding or upholstery.   -Sexuality: Discussed sexually transmitted diseases, partner selection, use of condoms, avoidance of unintended pregnancy and contraceptive alternatives.   -Dental health: Discussed  importance of regular tooth brushing, flossing, and dental visits.  -Health maintenance and immunizations reviewed. Please refer to Health maintenance section.  Return to care in 1 year for next preventative visit.     Subjective:  HPI:  He has no acute complaints today. See assessment / plan for status of chronic conditions.   Patient did have an episode of rectal bleeding a few weeks ago.  Months noticed a small lump to the area as well.  He thought this may be due to his dietary changes.  He has changed this and symptoms have improved over the last few weeks.  No longer having any rectal bleeding.  Still having small amount of pain but this is improving significantly.  Lifestyle Diet: Balanced. Plenty of fruits and vegetables. Trying to get more protein.  Exercise: Exercising at 4-5 days at work.      06/06/2023   12:57 PM  Depression screen PHQ 2/9  Decreased Interest 0  Down, Depressed, Hopeless 0  PHQ - 2 Score 0  Altered sleeping 0  Tired, decreased energy 0  Change in appetite 0  Feeling bad or failure about yourself  0  Trouble concentrating 0  Moving slowly or fidgety/restless 0  Suicidal thoughts 0  PHQ-9 Score 0  Difficult doing work/chores Not difficult at all   There are no preventive care reminders to display for this patient.   ROS: Per HPI, otherwise a complete review of systems was negative.   PMH:  The following were reviewed and entered/updated in epic: Past Medical History:  Diagnosis Date   HLD (hyperlipidemia)    Low vitamin D level    Patient Active Problem List   Diagnosis Date  Noted   Family history of heart disease 06/02/2022   Vitamin B12 deficiency 05/28/2020   Insomnia 12/11/2019   Low vitamin D level    HLD (hyperlipidemia) - CAC score 0 in 06/2022    History reviewed. No pertinent surgical history.  History reviewed. No pertinent family history.  Medications- reviewed and updated Current Outpatient Medications  Medication Sig  Dispense Refill   Vitamin D, Ergocalciferol, (DRISDOL) 1.25 MG (50000 UNIT) CAPS capsule Take 1 capsule (50,000 Units total) by mouth every 7 (seven) days. 12 capsule 0   No current facility-administered medications for this visit.    Allergies-reviewed and updated No Known Allergies  Social History   Socioeconomic History   Marital status: Married    Spouse name: Not on file   Number of children: Not on file   Years of education: Not on file   Highest education level: Bachelor's degree (e.g., BA, AB, BS)  Occupational History   Not on file  Tobacco Use   Smoking status: Never   Smokeless tobacco: Never  Substance and Sexual Activity   Alcohol use: Yes    Comment: occasionally   Drug use: No   Sexual activity: Yes  Other Topics Concern   Not on file  Social History Narrative   Not on file   Social Drivers of Health   Financial Resource Strain: Low Risk  (11/29/2022)   Overall Financial Resource Strain (CARDIA)    Difficulty of Paying Living Expenses: Not hard at all  Food Insecurity: Food Insecurity Present (11/29/2022)   Hunger Vital Sign    Worried About Running Out of Food in the Last Year: Never true    Ran Out of Food in the Last Year: Sometimes true  Transportation Needs: No Transportation Needs (11/29/2022)   PRAPARE - Administrator, Civil Service (Medical): No    Lack of Transportation (Non-Medical): No  Physical Activity: Sufficiently Active (11/29/2022)   Exercise Vital Sign    Days of Exercise per Week: 5 days    Minutes of Exercise per Session: 60 min  Stress: No Stress Concern Present (11/29/2022)   [employer] of Occupational Health - Occupational Stress Questionnaire    Feeling of Stress : Only a little  Social Connections: Moderately Integrated (11/29/2022)   Social Connection and Isolation Panel [NHANES]    Frequency of Communication with Friends and Family: More than three times a week    Frequency of Social Gatherings with Friends  and Family: Twice a week    Attends Religious Services: More than 4 times per year    Active Member of [employer] or Organizations: No    Attends Engineer, structural: Not on file    Marital Status: Married        Objective:  Physical Exam: BP 114/74   Pulse 76   Temp 97.8 F (36.6 C) (Temporal)   Resp 18   Ht 6\' 3"  (1.905 m)   Wt 229 lb 4 oz (104 kg)   SpO2 97%   BMI 28.65 kg/m   Body mass index is 28.65 kg/m. Wt Readings from Last 3 Encounters:  06/06/23 229 lb 4 oz (104 kg)  12/29/22 217 lb (98.4 kg)  11/30/22 217 lb 6.4 oz (98.6 kg)   Gen: NAD, resting comfortably HEENT: TMs normal bilaterally. OP clear. No thyromegaly noted.  CV: RRR with no murmurs appreciated Pulm: NWOB, CTAB with no crackles, wheezes, or rhonchi GI: Normal bowel sounds present. Soft, Nontender, Nondistended. MSK: no edema, cyanosis,  or clubbing noted GU: Small approximately 1 cm external hemorrhoid at right posterior anal edge Skin: warm, dry Neuro: CN2-12 grossly intact. Strength 5/5 in upper and lower extremities. Reflexes symmetric and intact bilaterally.  Psych: Normal affect and thought content     Tabatha Razzano M. Jimmey Ralph, MD 06/06/2023 1:32 PM

## 2023-06-06 NOTE — Patient Instructions (Signed)
It was very nice to see you today!  You had a small hemorrhoid.  This should improve.  You can try taking Colace.  Try do not sit on until it more than 5 to 10 minutes at a time.  Let us know if this does not continue to improve.  Your vitamin D is low.  Please start the vitamin D supplement.  Please continue to work on diet and exercise.  Will see back in year for your next physical.  Come back sooner if needed.  Return in about 1 year (around 06/05/2024) for Annual Physical.   Take care, Dr Jimmey Ralph  PLEASE NOTE:  If you had any lab tests, please let us know if you have not heard back within a few days. You may see your results on mychart before we have a chance to review them but we will give you a call once they are reviewed by [place].   If we ordered any referrals today, please let us know if you have not heard from their office within the next week.   If you had any urgent prescriptions sent in today, please check with the pharmacy within an hour of our visit to make sure the prescription was transmitted appropriately.   Please try these tips to maintain a healthy lifestyle:  Eat at least 3 REAL meals and 1-2 snacks per day.  Aim for no more than 5 hours between eating.  If you eat breakfast, please do so within one hour of getting up.   Each meal should contain half fruits/vegetables, one quarter protein, and one quarter carbs (no bigger than a computer mouse)  Cut down on sweet beverages. This includes juice, soda, and sweet tea.   Drink at least 1 glass of water with each meal and aim for at least 8 glasses per day  Exercise at least 150 minutes every week.    Preventive Care 58-30 Years Old, Male Preventive care refers to lifestyle choices and visits with your health care provider that can promote health and wellness. Preventive care visits are also called wellness exams. What can I expect for my preventive care visit? Counseling During your preventive care visit, your  health care provider may ask about your: Medical history, including: Past medical problems. Family medical history. Current health, including: Emotional well-being. Home life and relationship well-being. Sexual activity. Lifestyle, including: Alcohol, nicotine or tobacco, and drug use. Access to firearms. Diet, exercise, and sleep habits. Safety issues such as seatbelt and bike helmet use. Sunscreen use. Work and work Astronomer. Physical exam Your health care provider will check your: Height and weight. These may be used to calculate your BMI (body mass index). BMI is a measurement that tells if you are at a healthy weight. Waist circumference. This measures the distance around your waistline. This measurement also tells if you are at a healthy weight and may help predict your risk of certain diseases, such as type 2 diabetes and high blood pressure. Heart rate and blood pressure. Body temperature. Skin for abnormal spots. What immunizations do I need?  Vaccines are usually given at various ages, according to a schedule. Your health care provider will recommend vaccines for you based on your age, medical history, and lifestyle or other factors, such as travel or where you work. What tests do I need? Screening Your health care provider may recommend screening tests for certain conditions. This may include: Lipid and cholesterol levels. Diabetes screening. This is done by checking your blood sugar (glucose)  after you have not eaten for a while (fasting). Hepatitis B test. Hepatitis C test. HIV (human immunodeficiency virus) test. STI (sexually transmitted infection) testing, if you are at risk. Lung cancer screening. Prostate cancer screening. Colorectal cancer screening. Talk with your health care provider about your test results, treatment options, and if necessary, the need for more tests. Follow these instructions at home: Eating and drinking  Eat a diet that includes  fresh fruits and vegetables, whole grains, lean protein, and low-fat dairy products. Take vitamin and mineral supplements as recommended by your health care provider. Do not drink alcohol if your health care provider tells you not to drink. If you drink alcohol: Limit how much you have to 0-2 drinks a day. Know how much alcohol is in your drink. In the [place] one drink equals one 12 oz bottle of beer (355 mL), one 5 oz glass of wine (148 mL), or one 1 oz glass of hard liquor (44 mL). Lifestyle Brush your teeth every morning and night with fluoride toothpaste. Floss one time each day. Exercise for at least 30 minutes 5 or more days each week. Do not use any products that contain nicotine or tobacco. These products include cigarettes, chewing tobacco, and vaping devices, such as e-cigarettes. If you need help quitting, ask your health care provider. Do not use drugs. If you are sexually active, practice safe sex. Use a condom or other form of protection to prevent STIs. Take aspirin only as told by your health care provider. Make sure that you understand how much to take and what form to take. Work with your health care provider to find out whether it is safe and beneficial for you to take aspirin daily. Find healthy ways to manage stress, such as: Meditation, yoga, or listening to music. Journaling. Talking to a trusted person. Spending time with friends and family. Minimize exposure to UV radiation to reduce your risk of skin cancer. Safety Always wear your seat belt while driving or riding in a vehicle. Do not drive: If you have been drinking alcohol. Do not ride with someone who has been drinking. When you are tired or distracted. While texting. If you have been using any mind-altering substances or drugs. Wear a helmet and other protective equipment during sports activities. If you have firearms in your house, make sure you follow all gun safety procedures. What's next? Go to your  health care provider once a year for an annual wellness visit. Ask your health care provider how often you should have your eyes and teeth checked. Stay up to date on all vaccines. This information is not intended to replace advice given to you by your health care provider. Make sure you discuss any questions you have with your health care provider. Document Revised: 12/03/2020 Document Reviewed: 12/03/2020 Elsevier Patient Education  2024 [employer].

## 2023-06-06 NOTE — Assessment & Plan Note (Signed)
LDL above goal.  He did have a cardiac CT scan several months ago which was normal.  He is working on lifestyle interventions.  Will recheck lipids in 1 year.

## 2023-06-06 NOTE — Assessment & Plan Note (Signed)
B12 on low range of normal.  He will start taking over-the-counter B12 supplement.

## 2023-06-07 ENCOUNTER — Encounter: Payer: BC Managed Care – PPO | Admitting: Family Medicine

## 2023-06-07 LAB — URINALYSIS, ROUTINE W REFLEX MICROSCOPIC
Bilirubin Urine: NEGATIVE
Hgb urine dipstick: NEGATIVE
Ketones, ur: NEGATIVE
Leukocytes,Ua: NEGATIVE
Nitrite: NEGATIVE
RBC / HPF: NONE SEEN (ref 0–?)
Specific Gravity, Urine: <=1.005 — AB (ref 1.000–1.030)
Total Protein, Urine: NEGATIVE
Urine Glucose: NEGATIVE
Urobilinogen, UA: 0.2 (ref 0.0–1.0)
WBC, UA: NONE SEEN (ref 0–?)
pH: 7 (ref 5.0–8.0)

## 2023-06-08 NOTE — Progress Notes (Signed)
Urine test is normal

## 2023-09-01 ENCOUNTER — Other Ambulatory Visit: Payer: Self-pay | Admitting: Family Medicine

## 2023-09-01 DIAGNOSIS — R7989 Other specified abnormal findings of blood chemistry: Secondary | ICD-10-CM

## 2024-03-07 ENCOUNTER — Telehealth: Payer: Self-pay | Admitting: *Deleted

## 2024-03-07 NOTE — Telephone Encounter (Signed)
 Copied from CRM #[id number]. Topic: Appointments - Scheduling Inquiry for Clinic >> Mar 06, 2024  5:46 PM Tysheama G wrote: Reason for CRM: Patient would like to get Hep B and HPV shot. Callback number [phone]   Ok to schedule a nurse visit or need to see PCP? Last OV 06/06/2023 Daine Gunther,RMA

## 2024-03-07 NOTE — Telephone Encounter (Signed)
 Ok to schedule a nurse visit for Hep B and HPV  vaccine per Dr Kennyth

## 2024-03-07 NOTE — Telephone Encounter (Signed)
 Ok to schedule as a nurses visit.  Worth HERO. Kennyth, MD 03/07/2024 10:08 AM

## 2024-03-07 NOTE — Telephone Encounter (Signed)
 Copied from CRM #[id number]. Topic: Appointments - Scheduling Inquiry for Clinic >> Mar 06, 2024  5:51 PM Tysheama G wrote: Reason for CRM: Patient would like to have some blood wok done. Callback number [phone]   Patient need OV to discuss. Lavone Weisel,RMA

## 2024-03-08 ENCOUNTER — Ambulatory Visit: Admitting: Family Medicine

## 2024-03-12 ENCOUNTER — Telehealth: Payer: Self-pay | Admitting: *Deleted

## 2024-03-12 ENCOUNTER — Ambulatory Visit (INDEPENDENT_AMBULATORY_CARE_PROVIDER_SITE_OTHER): Admitting: Family Medicine

## 2024-03-12 DIAGNOSIS — R7989 Other specified abnormal findings of blood chemistry: Secondary | ICD-10-CM | POA: Diagnosis not present

## 2024-03-12 MED ORDER — VITAMIN D (ERGOCALCIFEROL) 1.25 MG (50000 UNIT) PO CAPS: 50000.0000 [IU] | ORAL_CAPSULE | ORAL | 0 refills | Status: DC

## 2024-03-12 NOTE — Patient Instructions (Addendum)
 It was very nice to see you today!  VISIT SUMMARY: You came in for a blood pressure recheck and vaccinations. We administered the first dose of the HPV vaccine and discussed your hepatitis B vaccine schedule. We also addressed your concerns about insurance coverage for your annual physical and blood work, and refilled your vitamin D  prescription.  YOUR PLAN: IMMUNIZATION: You are due for the HPV and hepatitis B vaccines. You received the first dose of the hepatitis B vaccine about a year and a half ago. -We administered the first dose of the HPV vaccine today. -We will coordinate follow-up for the next doses of both vaccines with your next physical exam.  VITAMIN D  DEFICIENCY: You are currently taking vitamin D  supplements. -We refilled your vitamin D  prescription. -We will check your vitamin D  levels at your next visit.  Return for Annual Physical.   Take care, Dr Kennyth  PLEASE NOTE:  If you had any lab tests, please let us  know if you have not heard back within a few days. You may see your results on mychart before we have a chance to review them but we will give you a call once they are reviewed by us .   If we ordered any referrals today, please let us  know if you have not heard from their office within the next week.   If you had any urgent prescriptions sent in today, please check with the pharmacy within an hour of our visit to make sure the prescription was transmitted appropriately.   Please try these tips to maintain a healthy lifestyle:  Eat at least 3 REAL meals and 1-2 snacks per day.  Aim for no more than 5 hours between eating.  If you eat breakfast, please do so within one hour of getting up.   Each meal should contain half fruits/vegetables, one quarter protein, and one quarter carbs (no bigger than a computer mouse)  Cut down on sweet beverages. This includes juice, soda, and sweet tea.   Drink at least 1 glass of water with each meal and aim for at least 8  glasses per day  Exercise at least 150 minutes every week.

## 2024-03-12 NOTE — Telephone Encounter (Signed)
 Copied from CRM [id number]. Topic: Clinical - Request for Lab/Test Order >> Mar 12, 2024 12:11 PM Brent Pineda wrote: Reason for CRM: Patient called in to be scheduled for  Hep B and HPV  vaccine. Per [facility] clinic has to schedule  Patient had an OV with PCP Will call insurance for coverage  [facility]

## 2024-03-12 NOTE — Progress Notes (Addendum)
   Brent Pineda is a 43 y.o. male who presents today for an office visit.  Assessment/Plan:  Chronic Problems Addressed Today: Low vitamin D  level On vitamin D  50000 weekly though prescription ran out.  We did discuss rechecking today however we will defer this until he comes back for his annual physical in a few months.  Will refill vitamin D  prescription today.  He will check if insurance will over hepatitis B and HPV vaccines and come back soon for these if they are covered. He will come back soon for CPE.    Subjective:  HPI:  See assessment / plan for status of chronic conditions.   Discussed the use of AI scribe software for clinical note transcription with the patient, who gave verbal consent to proceed.  History of Present Illness Brent Pineda is a 43 year old male who presents for a blood pressure recheck and vaccinations.  He is here to receive vaccinations, including the flu shot, HPV, and hepatitis B vaccines. He previously received the first dose of the hepatitis B vaccine approximately a year and a half ago. Both the HPV and hepatitis B vaccines are three-dose series.  He inquired about his vitamin D  levels and requested a refill for his vitamin D  prescription.         Objective:  Physical Exam: BP 106/68   Pulse 75   Temp 97.8 F (36.6 C) (Temporal)   Ht 6' 3 (1.905 m)   Wt 238 lb 3.2 oz (108 kg)   SpO2 98%   BMI 29.77 kg/m   Gen: No acute distress, resting comfortably CV: Regular rate and rhythm with no murmurs appreciated Pulm: Normal work of breathing, clear to auscultation bilaterally with no crackles, wheezes, or rhonchi Neuro: Grossly normal, moves all extremities Psych: Normal affect and thought content      Elia Nunley M. Kennyth, MD 03/12/2024 11:40 AM

## 2024-03-12 NOTE — Assessment & Plan Note (Signed)
 On vitamin D  50000 weekly though prescription ran out.  We did discuss rechecking today however we will defer this until he comes back for his annual physical in a few months.  Will refill vitamin D  prescription today.

## 2024-03-15 NOTE — Telephone Encounter (Signed)
 SABRA

## 2024-03-22 ENCOUNTER — Ambulatory Visit (INDEPENDENT_AMBULATORY_CARE_PROVIDER_SITE_OTHER): Admitting: *Deleted

## 2024-03-22 DIAGNOSIS — Z23 Encounter for immunization: Secondary | ICD-10-CM

## 2024-03-22 NOTE — Progress Notes (Signed)
 Patient present for Hep B and HPV vaccine  Given IM  Patient tolerated well

## 2024-04-24 ENCOUNTER — Ambulatory Visit

## 2024-05-09 ENCOUNTER — Encounter: Payer: Self-pay | Admitting: Family Medicine

## 2024-05-09 NOTE — Telephone Encounter (Signed)
 Please schedule a nurse visit for HPV vaccine  #2

## 2024-05-10 ENCOUNTER — Ambulatory Visit (INDEPENDENT_AMBULATORY_CARE_PROVIDER_SITE_OTHER)

## 2024-05-10 DIAGNOSIS — Z23 Encounter for immunization: Secondary | ICD-10-CM

## 2024-05-10 NOTE — Progress Notes (Signed)
 Patient is in office today for a nurse visit for Immunization, per PCP's order. Patient Injection was given in the  Left deltoid. Patient tolerated injection well.

## 2024-07-02 ENCOUNTER — Ambulatory Visit: Admitting: Family Medicine

## 2024-07-02 DIAGNOSIS — R739 Hyperglycemia, unspecified: Secondary | ICD-10-CM | POA: Diagnosis not present

## 2024-07-02 DIAGNOSIS — Z1329 Encounter for screening for other suspected endocrine disorder: Secondary | ICD-10-CM

## 2024-07-02 DIAGNOSIS — Z0000 Encounter for general adult medical examination without abnormal findings: Secondary | ICD-10-CM | POA: Diagnosis not present

## 2024-07-02 DIAGNOSIS — E782 Mixed hyperlipidemia: Secondary | ICD-10-CM | POA: Diagnosis not present

## 2024-07-02 DIAGNOSIS — E538 Deficiency of other specified B group vitamins: Secondary | ICD-10-CM

## 2024-07-02 DIAGNOSIS — R7989 Other specified abnormal findings of blood chemistry: Secondary | ICD-10-CM

## 2024-07-02 LAB — COMPREHENSIVE METABOLIC PANEL WITH GFR
ALT: 18 U/L (ref 3–53)
AST: 18 U/L (ref 5–37)
Albumin: 4.5 g/dL (ref 3.5–5.2)
Alkaline Phosphatase: 60 U/L (ref 39–117)
BUN: 14 mg/dL (ref 6–23)
CO2: 27 meq/L (ref 19–32)
Calcium: 9.4 mg/dL (ref 8.4–10.5)
Chloride: 105 meq/L (ref 96–112)
Creatinine, Ser: 0.72 mg/dL (ref 0.40–1.50)
GFR: 112.78 mL/min
Glucose, Bld: 89 mg/dL (ref 70–99)
Potassium: 4.4 meq/L (ref 3.5–5.1)
Sodium: 138 meq/L (ref 135–145)
Total Bilirubin: 0.4 mg/dL (ref 0.2–1.2)
Total Protein: 7.5 g/dL (ref 6.0–8.3)

## 2024-07-02 LAB — LIPID PANEL
Cholesterol: 214 mg/dL — ABNORMAL HIGH (ref 28–200)
HDL: 37.4 mg/dL — ABNORMAL LOW
LDL Cholesterol: 130 mg/dL — ABNORMAL HIGH (ref 10–99)
NonHDL: 176.41
Total CHOL/HDL Ratio: 6
Triglycerides: 234 mg/dL — ABNORMAL HIGH (ref 10.0–149.0)
VLDL: 46.8 mg/dL — ABNORMAL HIGH (ref 0.0–40.0)

## 2024-07-02 LAB — CBC
HCT: 41.8 % (ref 39.0–52.0)
Hemoglobin: 14.2 g/dL (ref 13.0–17.0)
MCHC: 34 g/dL (ref 30.0–36.0)
MCV: 87.8 fl (ref 78.0–100.0)
Platelets: 216 K/uL (ref 150.0–400.0)
RBC: 4.76 Mil/uL (ref 4.22–5.81)
RDW: 13.2 % (ref 11.5–15.5)
WBC: 5.4 K/uL (ref 4.0–10.5)

## 2024-07-02 LAB — TSH: TSH: 1.13 u[IU]/mL (ref 0.35–5.50)

## 2024-07-02 LAB — VITAMIN B12: Vitamin B-12: 316 pg/mL (ref 211–911)

## 2024-07-02 LAB — VITAMIN D 25 HYDROXY (VIT D DEFICIENCY, FRACTURES): VITD: 12.83 ng/mL — ABNORMAL LOW (ref 30.00–100.00)

## 2024-07-02 LAB — HEMOGLOBIN A1C: Hgb A1c MFr Bld: 5.8 % (ref 4.6–6.5)

## 2024-07-02 NOTE — Progress Notes (Signed)
 "  Chief Complaint:  Brent Pineda is a 44 y.o. male who presents today for his annual comprehensive physical exam.    Assessment/Plan:  Chronic Problems Addressed Today: Low vitamin D  level Check vitamin D    HLD (hyperlipidemia) - CAC score 0 in 06/2022 Check lipids.   Vitamin B12 deficiency Check B12.  Preventative Healthcare: Check labs.  He will come back for HPV and hep B vaccine.  Up-to-date on other vaccines.  Patient Counseling(The following topics were reviewed and/or handout was given):  -Nutrition: Stressed importance of moderation in sodium/caffeine intake, saturated fat and cholesterol, caloric balance, sufficient intake of fresh fruits, vegetables, and fiber.  -Stressed the importance of regular exercise.   -Substance Abuse: Discussed cessation/primary prevention of tobacco, alcohol, or other drug use; driving or other dangerous activities under the influence; availability of treatment for abuse.   -Injury prevention: Discussed safety belts, safety helmets, smoke detector, smoking near bedding or upholstery.   -Sexuality: Discussed sexually transmitted diseases, partner selection, use of condoms, avoidance of unintended pregnancy and contraceptive alternatives.   -Dental health: Discussed importance of regular tooth brushing, flossing, and dental visits.  -Health maintenance and immunizations reviewed. Please refer to Health maintenance section.  Return to care in 1 year for next preventative visit.     Subjective:  HPI:  He has no acute complaints today. Patient is here today for his annual physical.  See assessment / plan for status of chronic conditions.  Discussed the use of AI scribe software for clinical note transcription with the patient, who gave verbal consent to proceed.  History of Present Illness Brent Pineda is a 44 year old male who presents for an annual physical exam.  He has been doing well since his last visit a few months ago. He  received his flu shot and is due for blood work to update his vitamin D , B12, A1c, cholesterol, kidney function, liver function, electrolytes, and thyroid  levels.  He engages in strength training and aims to walk 10,000 steps, four to five days a week. He reports consuming vegetables and tries to avoid sweets, carbs, and processed foods. He reports doing strength training and walking regularly.  He has been following a vaccination schedule and has completed two doses of the HPV vaccine. He has also been informed about the hepatitis B vaccine schedule.  His work and home life are going well, and he has no current concerns regarding his vision or hearing.        07/02/2024   11:06 AM  Depression screen PHQ 2/9  Decreased Interest 0  Down, Depressed, Hopeless 0  PHQ - 2 Score 0    There are no preventive care reminders to display for this patient.   ROS: Per HPI, otherwise a complete review of systems was negative.   PMH:  The following were reviewed and entered/updated in epic: Past Medical History:  Diagnosis Date   HLD (hyperlipidemia)    Low vitamin D  level    Patient Active Problem List   Diagnosis Date Noted   Family history of heart disease 06/02/2022   Vitamin B12 deficiency 05/28/2020   Insomnia 12/11/2019   Low vitamin D  level    HLD (hyperlipidemia) - CAC score 0 in 06/2022    History reviewed. No pertinent surgical history.  History reviewed. No pertinent family history.  Medications- reviewed and updated Current Outpatient Medications  Medication Sig Dispense Refill   Vitamin D , Ergocalciferol , (DRISDOL ) 1.25 MG (50000 UNIT) CAPS capsule Take 1 capsule (50,000 Units  total) by mouth every 7 (seven) days. 12 capsule 0   No current facility-administered medications for this visit.    Allergies-reviewed and updated Allergies[1]  Social History   Socioeconomic History   Marital status: Married    Spouse name: Not on file   Number of children: Not on file    Years of education: Not on file   Highest education level: Bachelor's degree (e.g., BA, AB, BS)  Occupational History   Not on file  Tobacco Use   Smoking status: Never   Smokeless tobacco: Never  Substance and Sexual Activity   Alcohol use: Yes    Comment: occasionally   Drug use: No   Sexual activity: Yes  Other Topics Concern   Not on file  Social History Narrative   Not on file   Social Drivers of Health   Tobacco Use: Low Risk (07/02/2024)   Patient History    Smoking Tobacco Use: Never    Smokeless Tobacco Use: Never    Passive Exposure: Not on file  Financial Resource Strain: Low Risk (07/02/2024)   Overall Financial Resource Strain (CARDIA)    Difficulty of Paying Living Expenses: Not hard at all  Food Insecurity: Food Insecurity Present (07/02/2024)   Epic    Worried About Programme Researcher, Broadcasting/film/video in the Last Year: Never true    Ran Out of Food in the Last Year: Sometimes true  Transportation Needs: No Transportation Needs (07/02/2024)   Epic    Lack of Transportation (Medical): No    Lack of Transportation (Non-Medical): No  Physical Activity: Sufficiently Active (07/02/2024)   Exercise Vital Sign    Days of Exercise per Week: 6 days    Minutes of Exercise per Session: 60 min  Stress: No Stress Concern Present (07/02/2024)   [employer] of Occupational Health - Occupational Stress Questionnaire    Feeling of Stress: Not at all  Social Connections: Socially Integrated (07/02/2024)   Social Connection and Isolation Panel    Frequency of Communication with Friends and Family: Twice a week    Frequency of Social Gatherings with Friends and Family: Twice a week    Attends Religious Services: 1 to 4 times per year    Active Member of Clubs or Organizations: Yes    Attends Banker Meetings: 1 to 4 times per year    Marital Status: Married  Depression (PHQ2-9): Low Risk (07/02/2024)   Depression (PHQ2-9)    PHQ-2 Score: 0  Alcohol Screen: Low Risk  (07/02/2024)   Alcohol Screen    Last Alcohol Screening Score (AUDIT): 4  Housing: Unknown (07/02/2024)   Epic    Unable to Pay for Housing in the Last Year: No    Number of Times Moved in the Last Year: Not on file    Homeless in the Last Year: No  Utilities: Not on file  Health Literacy: Not on file        Objective:  Physical Exam: BP 124/80   Pulse 91   Temp 97.8 F (36.6 C) (Temporal)   Ht 6' 3 (1.905 m)   Wt 242 lb (109.8 kg)   SpO2 98%   BMI 30.25 kg/m   Body mass index is 30.25 kg/m. Wt Readings from Last 3 Encounters:  07/02/24 242 lb (109.8 kg)  03/12/24 238 lb 3.2 oz (108 kg)  06/06/23 229 lb 4 oz (104 kg)   Gen: NAD, resting comfortably HEENT: TMs normal bilaterally. OP clear. No thyromegaly noted.  CV: RRR with  no murmurs appreciated Pulm: NWOB, CTAB with no crackles, wheezes, or rhonchi GI: Normal bowel sounds present. Soft, Nontender, Nondistended. MSK: no edema, cyanosis, or clubbing noted Skin: warm, dry Neuro: CN2-12 grossly intact. Strength 5/5 in upper and lower extremities. Reflexes symmetric and intact bilaterally.  Psych: Normal affect and thought content     Honey Zakarian M. Kennyth, MD 07/02/2024 11:29 AM     [1] No Known Allergies  "

## 2024-07-02 NOTE — Assessment & Plan Note (Signed)
 Check vitamin D.

## 2024-07-02 NOTE — Patient Instructions (Signed)
 It was very nice to see you today!  VISIT SUMMARY: Today, you had your annual physical exam. You have been doing well and maintaining a healthy lifestyle with regular exercise and a balanced diet. We discussed your vaccination schedule and ordered comprehensive blood work to monitor various health parameters.  YOUR PLAN: VITAMIN D  DEFICIENCY: We need to check your vitamin D  levels. -Blood work has been ordered to check your vitamin D  levels.  VITAMIN B12 DEFICIENCY: We need to check your vitamin B12 levels. -Blood work has been ordered to check your vitamin B12 levels.  HYPERGLYCEMIA: We need to monitor your glucose levels. -Blood work has been ordered to check your A1c levels.  MIXED HYPERLIPIDEMIA: We need to check your cholesterol levels. -Blood work has been ordered to check your cholesterol levels.  GENERAL HEALTH MAINTENANCE: We discussed routine health maintenance, including diet, exercise, and vaccinations. -We reviewed your vaccination schedule and ordered comprehensive blood work for kidney function, liver function, electrolytes, and thyroid  levels. -A follow-up visit has been scheduled in April for your HPV and hepatitis B vaccines.  Return in about 1 year (around 07/02/2025) for Annual Physical.   Take care, Dr Kennyth  PLEASE NOTE:  If you had any lab tests, please let us  know if you have not heard back within a few days. You may see your results on mychart before we have a chance to review them but we will give you a call once they are reviewed by us .   If we ordered any referrals today, please let us  know if you have not heard from their office within the next week.   If you had any urgent prescriptions sent in today, please check with the pharmacy within an hour of our visit to make sure the prescription was transmitted appropriately.   Please try these tips to maintain a healthy lifestyle:  Eat at least 3 REAL meals and 1-2 snacks per day.  Aim for no more than 5  hours between eating.  If you eat breakfast, please do so within one hour of getting up.   Each meal should contain half fruits/vegetables, one quarter protein, and one quarter carbs (no bigger than a computer mouse)  Cut down on sweet beverages. This includes juice, soda, and sweet tea.   Drink at least 1 glass of water with each meal and aim for at least 8 glasses per day  Exercise at least 150 minutes every week.    Preventive Care 34-27 Years Old, Male Preventive care refers to lifestyle choices and visits with your health care provider that can promote health and wellness. Preventive care visits are also called wellness exams. What can I expect for my preventive care visit? Counseling During your preventive care visit, your health care provider may ask about your: Medical history, including: Past medical problems. Family medical history. Current health, including: Emotional well-being. Home life and relationship well-being. Sexual activity. Lifestyle, including: Alcohol, nicotine or tobacco, and drug use. Access to firearms. Diet, exercise, and sleep habits. Safety issues such as seatbelt and bike helmet use. Sunscreen use. Work and work astronomer. Physical exam Your health care provider will check your: Height and weight. These may be used to calculate your BMI (body mass index). BMI is a measurement that tells if you are at a healthy weight. Waist circumference. This measures the distance around your waistline. This measurement also tells if you are at a healthy weight and may help predict your risk of certain diseases, such as type 2  diabetes and high blood pressure. Heart rate and blood pressure. Body temperature. Skin for abnormal spots. What immunizations do I need?  Vaccines are usually given at various ages, according to a schedule. Your health care provider will recommend vaccines for you based on your age, medical history, and lifestyle or other factors, such as  travel or where you work. What tests do I need? Screening Your health care provider may recommend screening tests for certain conditions. This may include: Lipid and cholesterol levels. Diabetes screening. This is done by checking your blood sugar (glucose) after you have not eaten for a while (fasting). Hepatitis B test. Hepatitis C test. HIV (human immunodeficiency virus) test. STI (sexually transmitted infection) testing, if you are at risk. Lung cancer screening. Prostate cancer screening. Colorectal cancer screening. Talk with your health care provider about your test results, treatment options, and if necessary, the need for more tests. Follow these instructions at home: Eating and drinking  Eat a diet that includes fresh fruits and vegetables, whole grains, lean protein, and low-fat dairy products. Take vitamin and mineral supplements as recommended by your health care provider. Do not drink alcohol if your health care provider tells you not to drink. If you drink alcohol: Limit how much you have to 0-2 drinks a day. Know how much alcohol is in your drink. In the [place] one drink equals one 12 oz bottle of beer (355 mL), one 5 oz glass of wine (148 mL), or one 1 oz glass of hard liquor (44 mL). Lifestyle Brush your teeth every morning and night with fluoride toothpaste. Floss one time each day. Exercise for at least 30 minutes 5 or more days each week. Do not use any products that contain nicotine or tobacco. These products include cigarettes, chewing tobacco, and vaping devices, such as e-cigarettes. If you need help quitting, ask your health care provider. Do not use drugs. If you are sexually active, practice safe sex. Use a condom or other form of protection to prevent STIs. Take aspirin only as told by your health care provider. Make sure that you understand how much to take and what form to take. Work with your health care provider to find out whether it is safe and  beneficial for you to take aspirin daily. Find healthy ways to manage stress, such as: Meditation, yoga, or listening to music. Journaling. Talking to a trusted person. Spending time with friends and family. Minimize exposure to UV radiation to reduce your risk of skin cancer. Safety Always wear your seat belt while driving or riding in a vehicle. Do not drive: If you have been drinking alcohol. Do not ride with someone who has been drinking. When you are tired or distracted. While texting. If you have been using any mind-altering substances or drugs. Wear a helmet and other protective equipment during sports activities. If you have firearms in your house, make sure you follow all gun safety procedures. What's next? Go to your health care provider once a year for an annual wellness visit. Ask your health care provider how often you should have your eyes and teeth checked. Stay up to date on all vaccines. This information is not intended to replace advice given to you by your health care provider. Make sure you discuss any questions you have with your health care provider. Document Revised: 12/03/2020 Document Reviewed: 12/03/2020 Elsevier Patient Education  2024 [employer].

## 2024-07-02 NOTE — Assessment & Plan Note (Signed)
 Check lipids

## 2024-07-02 NOTE — Assessment & Plan Note (Signed)
 Check B12

## 2024-07-03 ENCOUNTER — Ambulatory Visit: Payer: Self-pay | Admitting: Family Medicine

## 2024-07-03 NOTE — Progress Notes (Signed)
 Vitamin D  is very low.  Recommend he start 50,000 IUs weekly.  Please send prescription.  It is important that he continue with maintenance after this including 5000 IUs daily.  We should recheck this again in 3 to 6 months.  His cholesterol is elevated but better than the last time that we checked.  Do not need to start meds for this but he should continue to work on diet and exercise and we can recheck this again in a year or so.  A1c is also mildly elevated but similar to last time.  As above he should continue to work on diet and exercise and we can recheck this again in a year or so.  All of his other labs are at goal and we can recheck again in a year.

## 2024-07-06 ENCOUNTER — Other Ambulatory Visit: Payer: Self-pay | Admitting: Family Medicine

## 2024-07-06 DIAGNOSIS — R7989 Other specified abnormal findings of blood chemistry: Secondary | ICD-10-CM

## 2024-07-09 ENCOUNTER — Other Ambulatory Visit: Payer: Self-pay | Admitting: *Deleted

## 2024-07-09 DIAGNOSIS — R7989 Other specified abnormal findings of blood chemistry: Secondary | ICD-10-CM

## 2024-07-09 MED ORDER — VITAMIN D (ERGOCALCIFEROL) 1.25 MG (50000 UNIT) PO CAPS: 50000.0000 [IU] | ORAL_CAPSULE | ORAL | 0 refills | Status: AC

## 2024-09-20 ENCOUNTER — Ambulatory Visit

## 2024-09-28 ENCOUNTER — Ambulatory Visit

## 2025-07-08 ENCOUNTER — Encounter: Admitting: Family Medicine
